# Patient Record
Sex: Female | Born: 1985 | Race: Black or African American | Hispanic: No | Marital: Married | Smoking: Never smoker
Health system: Southern US, Community
[De-identification: ages and names within clinical notes are randomized; demographics above are authoritative.]

## PROBLEM LIST (undated history)

## (undated) ENCOUNTER — Inpatient Hospital Stay (HOSPITAL_COMMUNITY): Payer: Self-pay

## (undated) DIAGNOSIS — D573 Sickle-cell trait: Secondary | ICD-10-CM

## (undated) DIAGNOSIS — D649 Anemia, unspecified: Secondary | ICD-10-CM

## (undated) HISTORY — PX: CYST REMOVAL NECK: SHX6281

## (undated) HISTORY — PX: NO PAST SURGERIES: SHX2092

## (undated) HISTORY — DX: Sickle-cell trait: D57.3

## (undated) HISTORY — PX: WISDOM TOOTH EXTRACTION: SHX21

---

## 2004-05-18 ENCOUNTER — Emergency Department (HOSPITAL_COMMUNITY): Admission: EM | Admit: 2004-05-18 | Discharge: 2004-05-18 | Payer: Self-pay | Admitting: Family Medicine

## 2004-06-11 ENCOUNTER — Emergency Department (HOSPITAL_COMMUNITY): Admission: EM | Admit: 2004-06-11 | Discharge: 2004-06-11 | Payer: Self-pay | Admitting: Family Medicine

## 2004-07-20 ENCOUNTER — Emergency Department (HOSPITAL_COMMUNITY): Admission: EM | Admit: 2004-07-20 | Discharge: 2004-07-20 | Payer: Self-pay

## 2004-10-05 ENCOUNTER — Emergency Department (HOSPITAL_COMMUNITY): Admission: EM | Admit: 2004-10-05 | Discharge: 2004-10-05 | Payer: Self-pay | Admitting: Family Medicine

## 2004-12-09 ENCOUNTER — Emergency Department (HOSPITAL_COMMUNITY): Admission: EM | Admit: 2004-12-09 | Discharge: 2004-12-10 | Payer: Self-pay | Admitting: Emergency Medicine

## 2006-03-05 ENCOUNTER — Emergency Department (HOSPITAL_COMMUNITY): Admission: EM | Admit: 2006-03-05 | Discharge: 2006-03-06 | Payer: Self-pay | Admitting: Emergency Medicine

## 2006-08-27 ENCOUNTER — Emergency Department (HOSPITAL_COMMUNITY): Admission: EM | Admit: 2006-08-27 | Discharge: 2006-08-27 | Payer: Self-pay | Admitting: Family Medicine

## 2006-09-29 ENCOUNTER — Ambulatory Visit (HOSPITAL_COMMUNITY): Admission: RE | Admit: 2006-09-29 | Discharge: 2006-09-29 | Payer: Self-pay | Admitting: Obstetrics & Gynecology

## 2006-12-15 ENCOUNTER — Ambulatory Visit (HOSPITAL_COMMUNITY): Admission: RE | Admit: 2006-12-15 | Discharge: 2006-12-15 | Payer: Self-pay | Admitting: Obstetrics and Gynecology

## 2007-01-16 ENCOUNTER — Inpatient Hospital Stay (HOSPITAL_COMMUNITY): Admission: AD | Admit: 2007-01-16 | Discharge: 2007-01-16 | Payer: Self-pay | Admitting: Obstetrics and Gynecology

## 2007-01-17 ENCOUNTER — Ambulatory Visit (HOSPITAL_COMMUNITY): Admission: RE | Admit: 2007-01-17 | Discharge: 2007-01-17 | Payer: Self-pay | Admitting: Obstetrics and Gynecology

## 2007-02-14 ENCOUNTER — Ambulatory Visit (HOSPITAL_COMMUNITY): Admission: RE | Admit: 2007-02-14 | Discharge: 2007-02-14 | Payer: Self-pay | Admitting: Obstetrics and Gynecology

## 2007-03-07 ENCOUNTER — Ambulatory Visit (HOSPITAL_COMMUNITY): Admission: RE | Admit: 2007-03-07 | Discharge: 2007-03-07 | Payer: Self-pay | Admitting: Obstetrics and Gynecology

## 2007-03-12 ENCOUNTER — Inpatient Hospital Stay (HOSPITAL_COMMUNITY): Admission: AD | Admit: 2007-03-12 | Discharge: 2007-03-12 | Payer: Self-pay | Admitting: Obstetrics and Gynecology

## 2007-03-13 ENCOUNTER — Inpatient Hospital Stay (HOSPITAL_COMMUNITY): Admission: AD | Admit: 2007-03-13 | Discharge: 2007-03-13 | Payer: Self-pay | Admitting: Obstetrics and Gynecology

## 2007-03-14 ENCOUNTER — Inpatient Hospital Stay (HOSPITAL_COMMUNITY): Admission: AD | Admit: 2007-03-14 | Discharge: 2007-03-14 | Payer: Self-pay | Admitting: Obstetrics and Gynecology

## 2007-03-15 ENCOUNTER — Encounter: Payer: Self-pay | Admitting: Obstetrics and Gynecology

## 2007-03-15 ENCOUNTER — Inpatient Hospital Stay (HOSPITAL_COMMUNITY): Admission: AD | Admit: 2007-03-15 | Discharge: 2007-03-16 | Payer: Self-pay | Admitting: Obstetrics and Gynecology

## 2007-03-28 ENCOUNTER — Ambulatory Visit (HOSPITAL_COMMUNITY): Admission: RE | Admit: 2007-03-28 | Discharge: 2007-03-28 | Payer: Self-pay | Admitting: Obstetrics and Gynecology

## 2007-04-18 ENCOUNTER — Ambulatory Visit (HOSPITAL_COMMUNITY): Admission: RE | Admit: 2007-04-18 | Discharge: 2007-04-18 | Payer: Self-pay | Admitting: Obstetrics and Gynecology

## 2007-05-01 ENCOUNTER — Inpatient Hospital Stay (HOSPITAL_COMMUNITY): Admission: AD | Admit: 2007-05-01 | Discharge: 2007-05-01 | Payer: Self-pay | Admitting: Obstetrics and Gynecology

## 2007-05-03 ENCOUNTER — Inpatient Hospital Stay (HOSPITAL_COMMUNITY): Admission: AD | Admit: 2007-05-03 | Discharge: 2007-05-06 | Payer: Self-pay | Admitting: Obstetrics and Gynecology

## 2007-05-03 ENCOUNTER — Encounter: Payer: Self-pay | Admitting: Obstetrics and Gynecology

## 2007-08-20 IMAGING — US US FETAL BPP W/O NONSTRESS
1 series · 14 of 28 positions shown · non-contrast
Comparison: none

OBSTETRICAL ULTRASOUND:

 This ultrasound exam was performed in the [HOSPITAL] Ultrasound Department.  The OB US report was generated in the AS system, and faxed to the ordering physician.  This report is also available in [REDACTED] PACS.

[Series 1: us fetal bpp w/o nonstress · 14 of 39 slices shown]
[im 2/39]
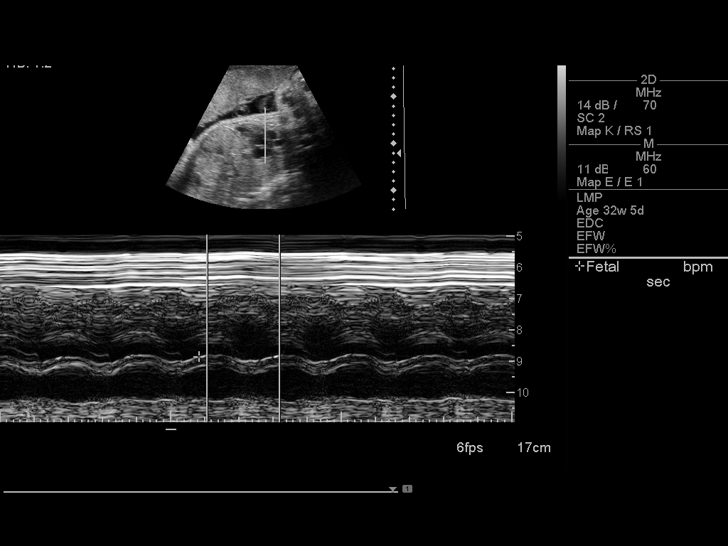
[im 5/39]
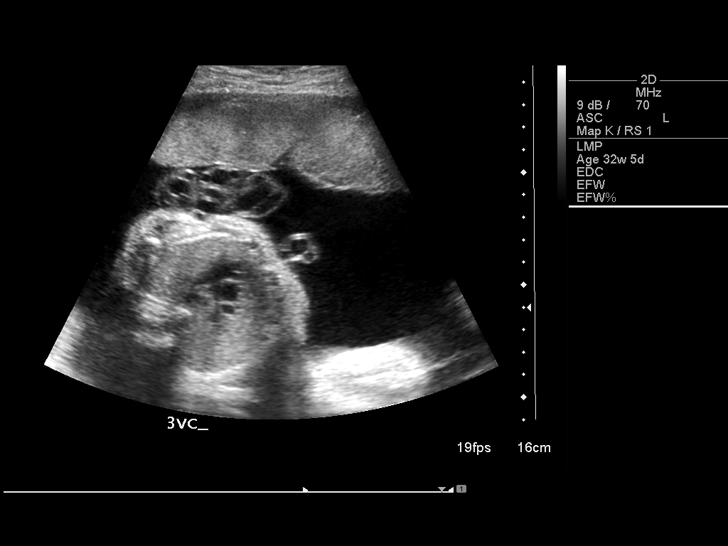
[im 8/39]
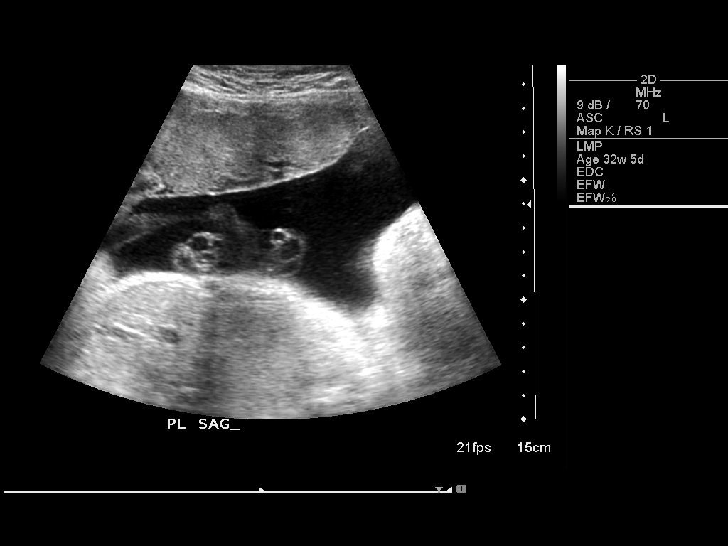
[im 10/39]
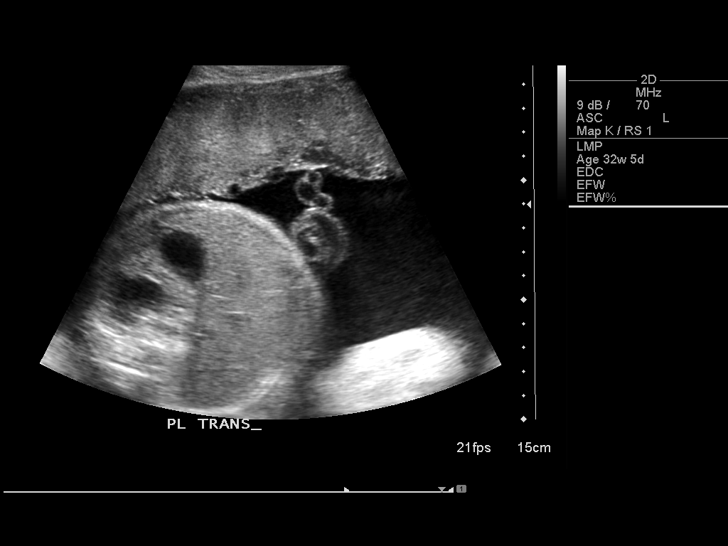
[im 13/39]
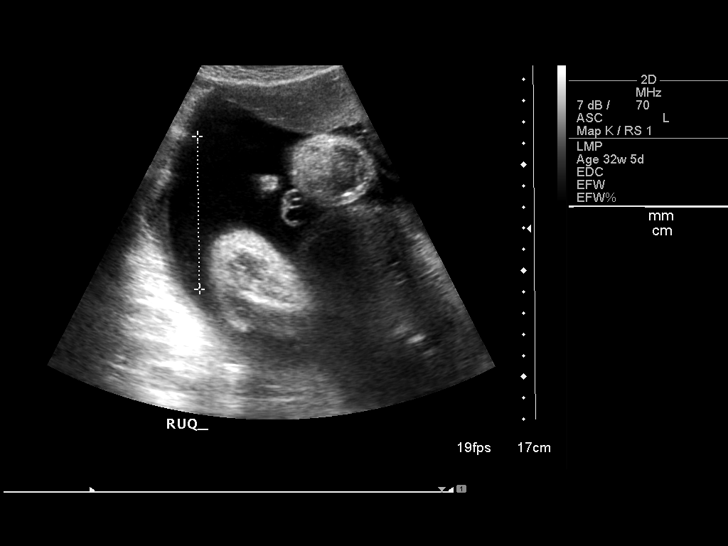
[im 16/39]
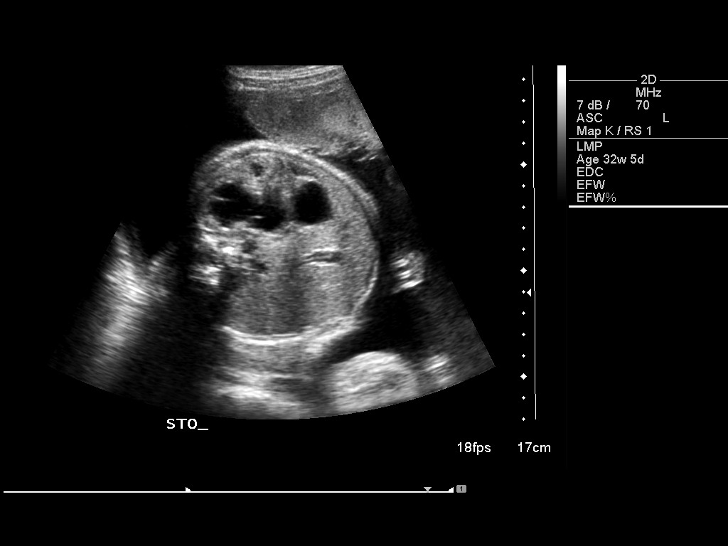
[im 19/39]
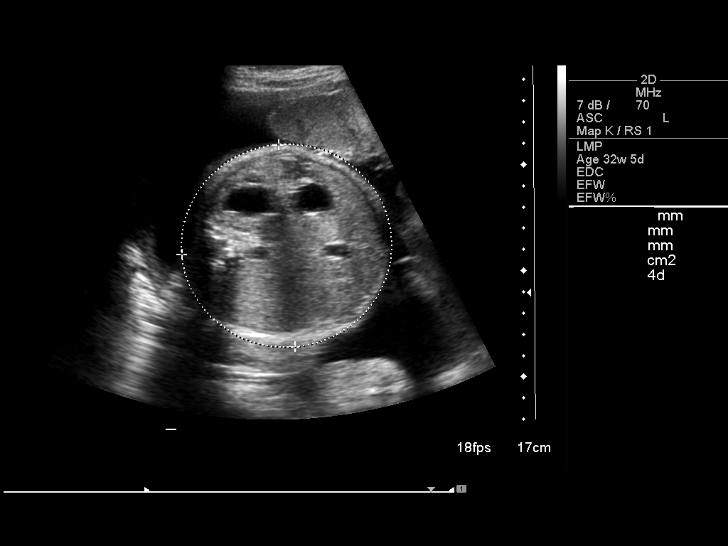
[im 22/39]
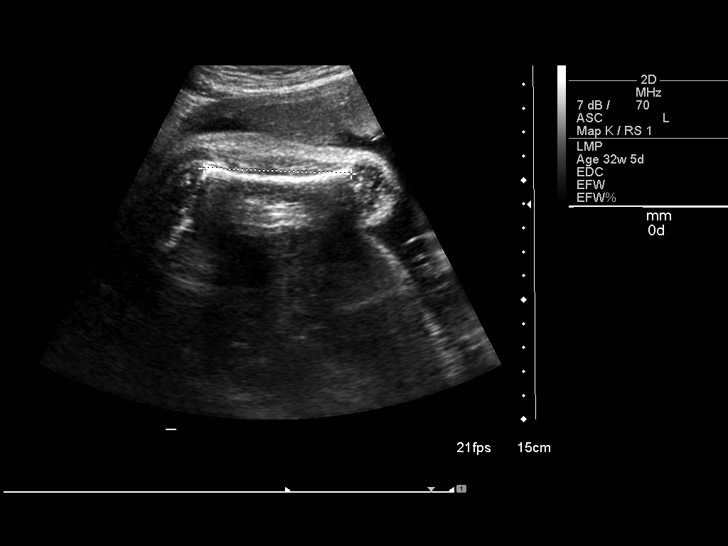
[im 24/39]
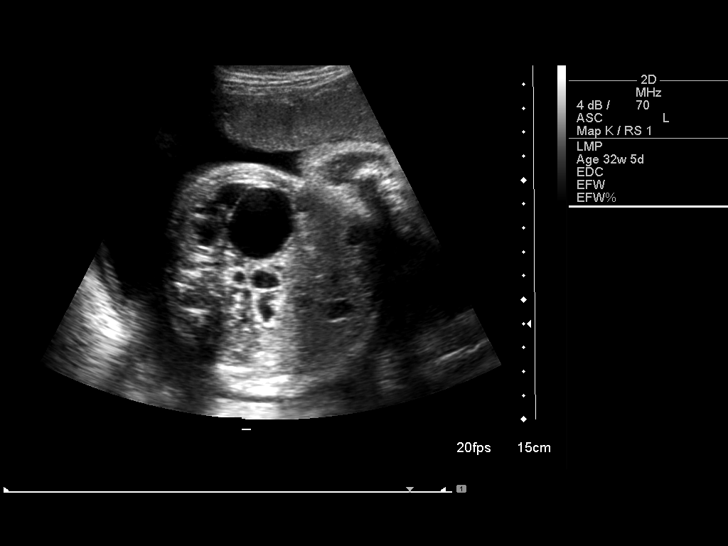
[im 27/39]
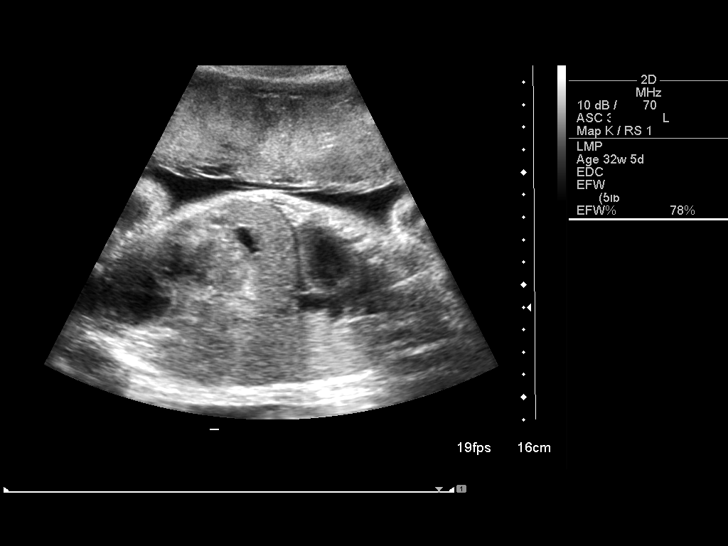
[im 30/39]
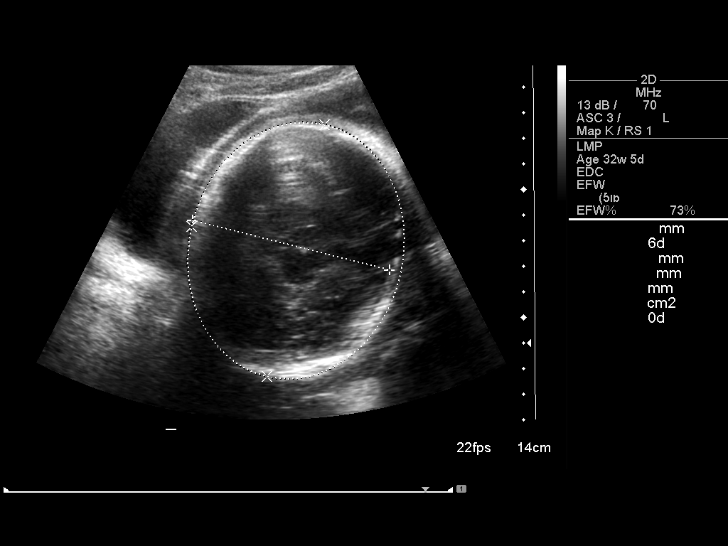
[im 33/39]
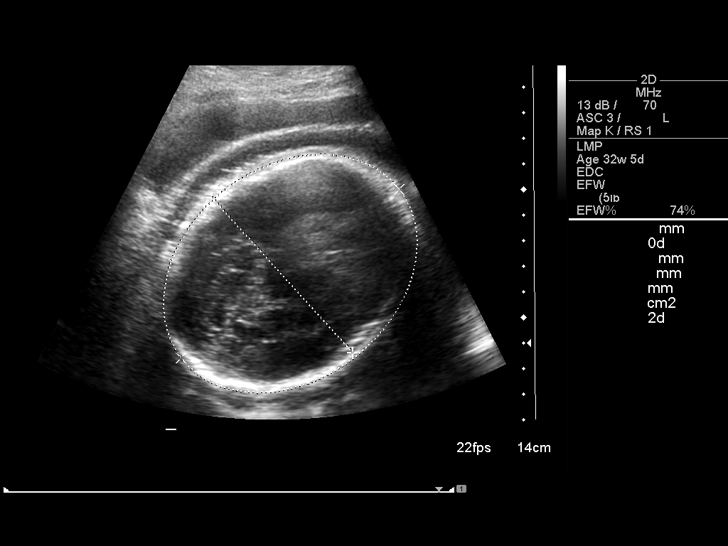
[im 36/39]
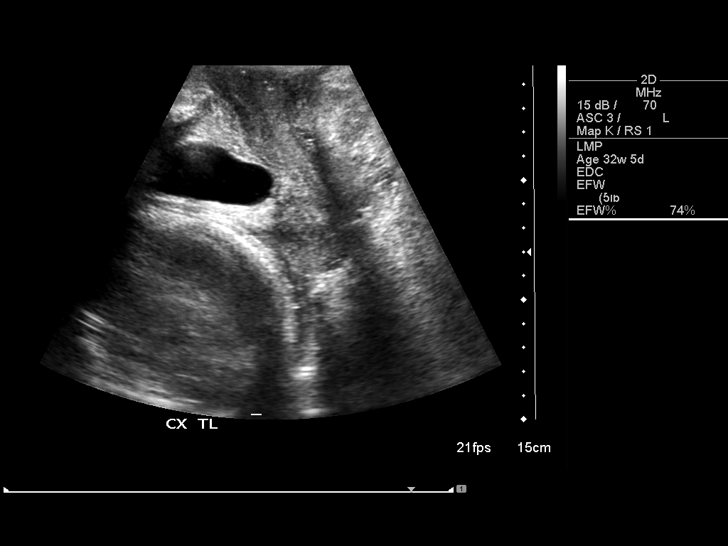
[im 39/39]
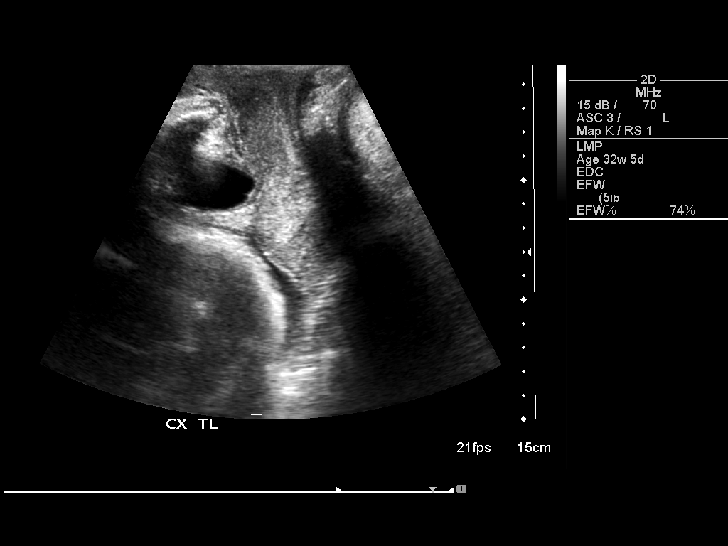

[14 of 28 positions shown; findings below may reference images not displayed]

IMPRESSION: See AS Obstetric US report.

## 2007-09-22 IMAGING — US US OB FOLLOW-UP
1 series · 14 of 28 positions shown · non-contrast
Comparison: none

OBSTETRICAL ULTRASOUND:
 This ultrasound was performed in The [HOSPITAL], and the AS OB/GYN report will be stored to [REDACTED] PACS.

[Series 1: us ob follow-up · 14 of 43 slices shown]
[im 2/43]
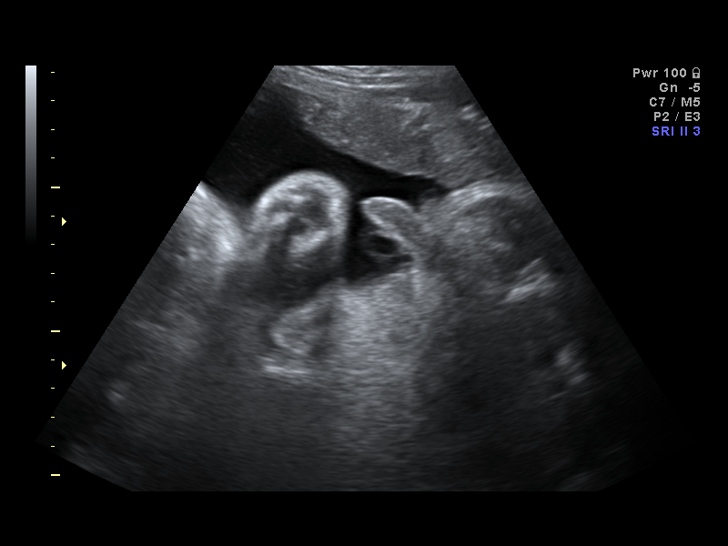
[im 5/43]
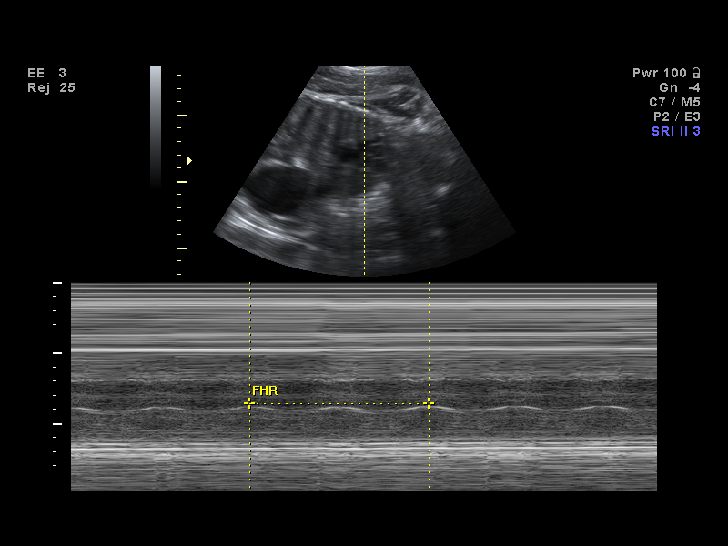
[im 8/43]
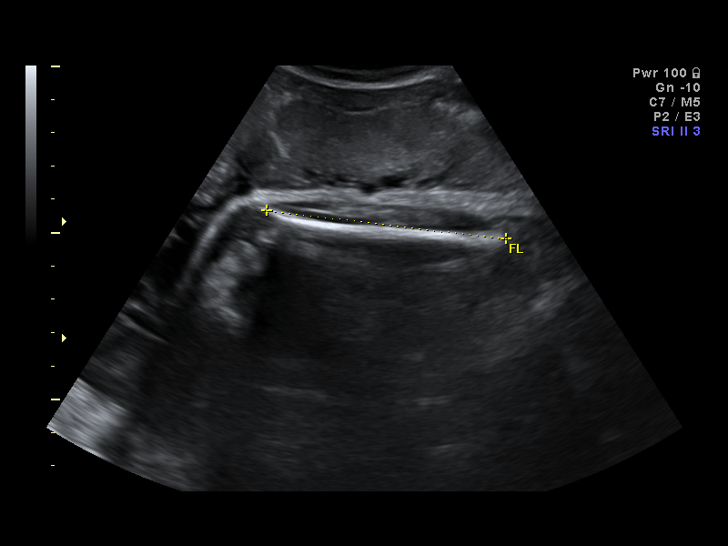
[im 11/43]
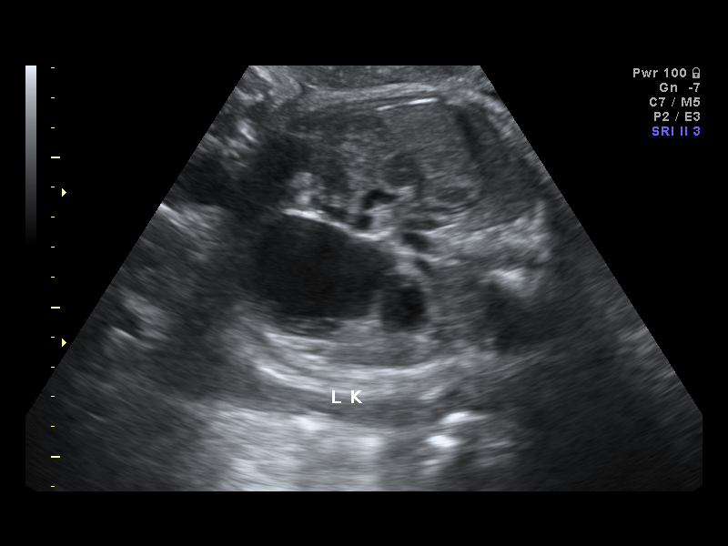
[im 15/43]
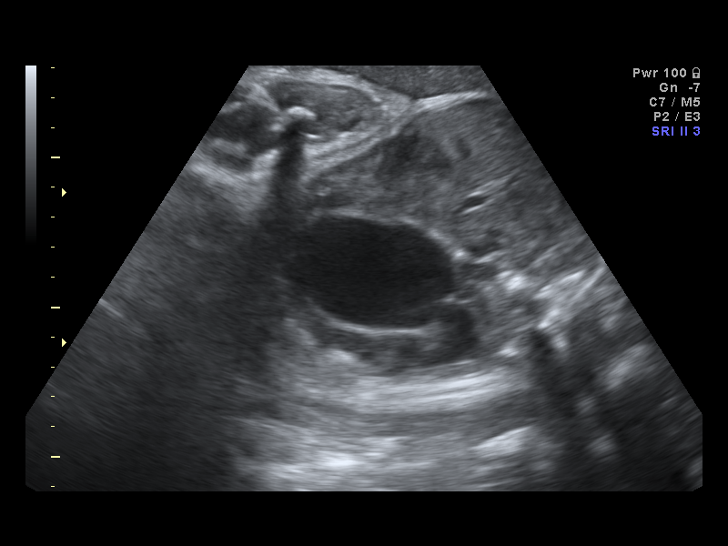
[im 18/43]
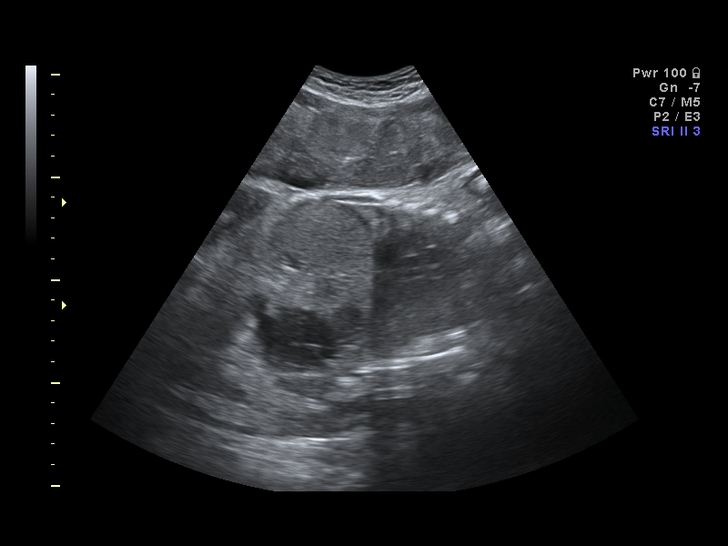
[im 21/43]
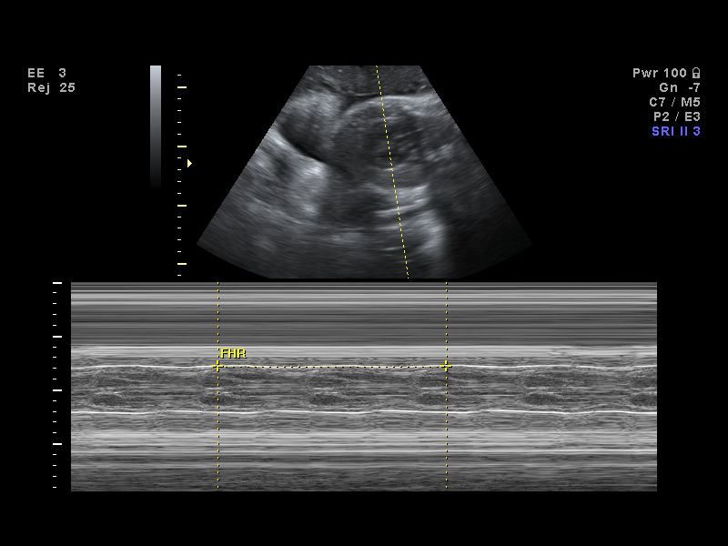
[im 24/43]
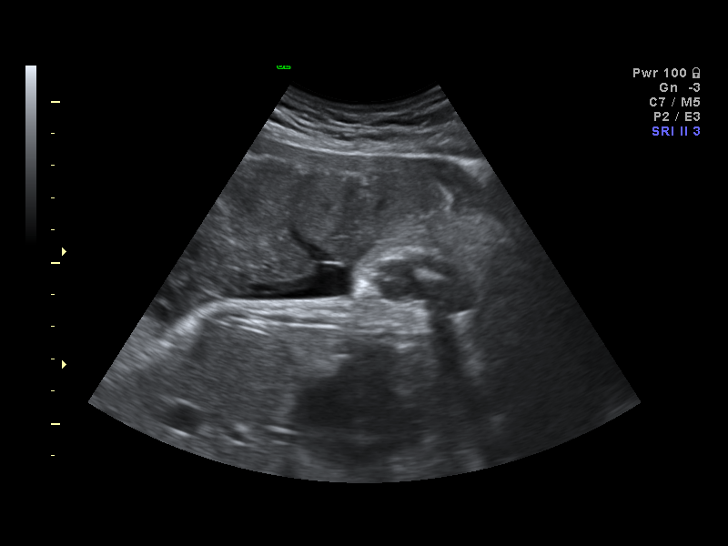
[im 27/43]
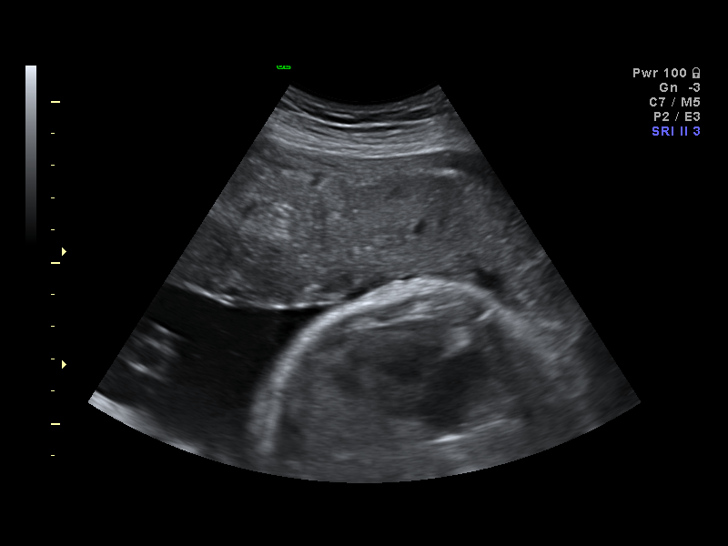
[im 30/43]
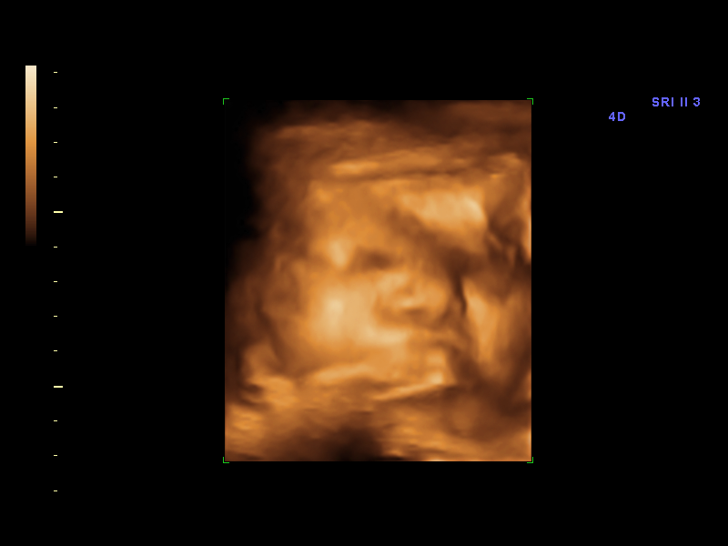
[im 33/43]
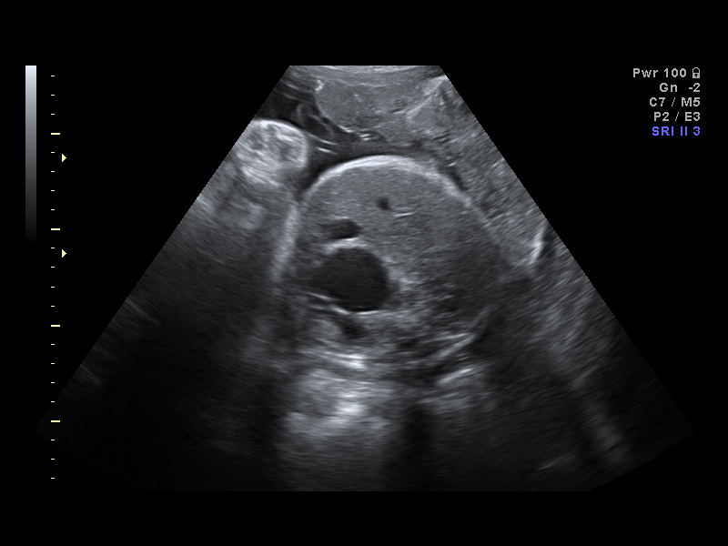
[im 36/43]
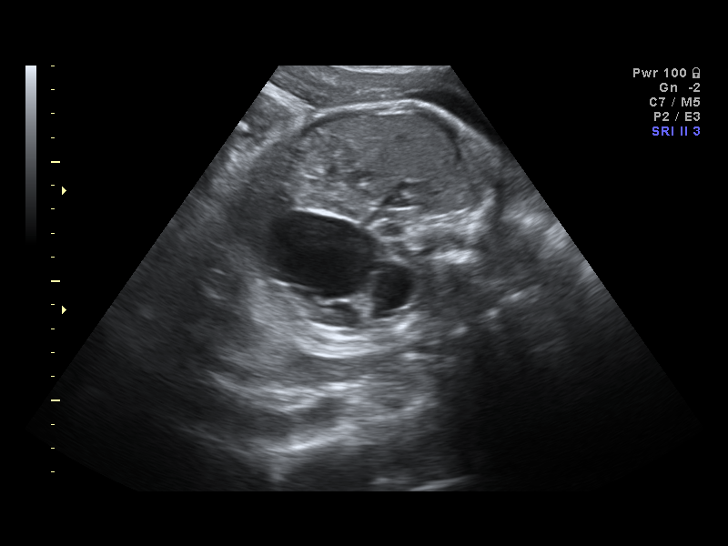
[im 39/43]
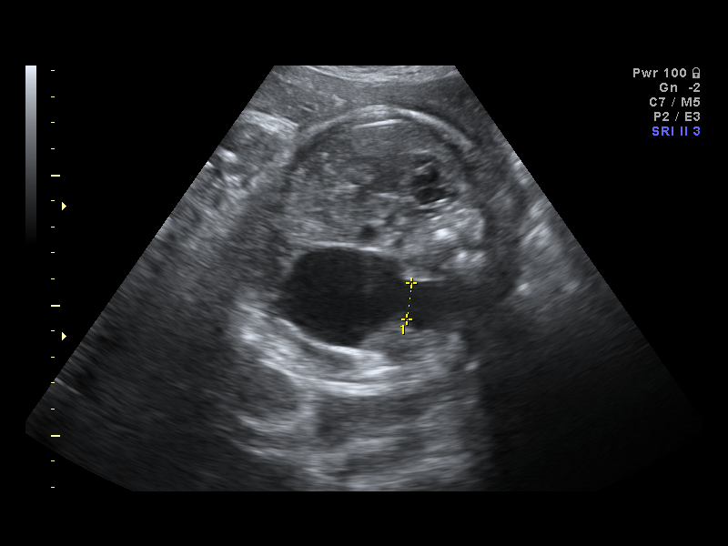
[im 43/43]
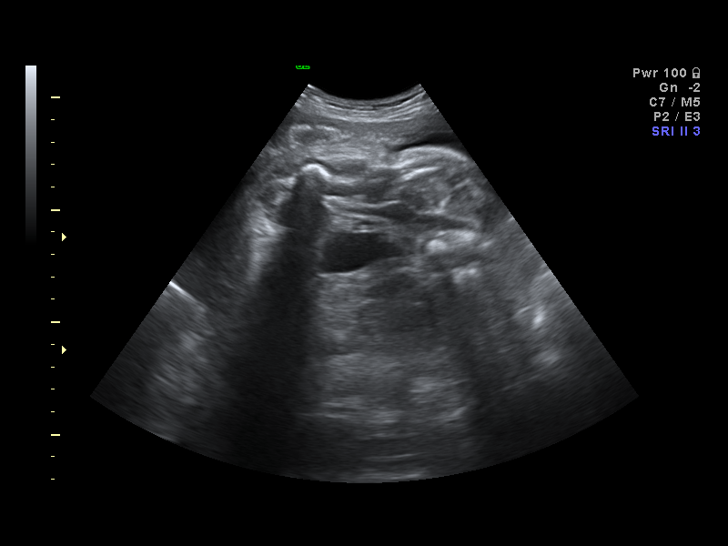

[14 of 28 positions shown; findings below may reference images not displayed]

IMPRESSION: The AS OB/GYN report has also been faxed to the ordering physician.

## 2010-12-26 ENCOUNTER — Encounter: Payer: Self-pay | Admitting: Obstetrics and Gynecology
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
## 2011-04-22 NOTE — Discharge Summary (Signed)
NAME:  Barbara Heath, Barbara Heath NO.:  [id number]   MEDICAL RECORD NO.:  [id number]          PATIENT TYPE:  OUT   LOCATION:  MFM                           FACILITY:  [facility]   PHYSICIAN:  Naima A. Dillard, M.D. DATE OF BIRTH:  Jun 28, 1986   DATE OF ADMISSION:  03/15/2007  DATE OF DISCHARGE:  03/16/2007                               DISCHARGE SUMMARY   ADMISSION DIAGNOSES:  1. Intrauterine pregnancy at 32-1/7 weeks.  2. Polyhydramnios.  3. Pre-term contractions without cervical change.   DISCHARGE DIAGNOSES:  1. Intrauterine pregnancy at 32-1/7 weeks.  2. Polyhydramnios.  3. Pre-term contractions without cervical change.   HOSPITAL PROCEDURES:  1. Electronic fetal monitoring.  2. Tocolysis.  3. Maternal fetal medicine consult.  4. Ultrasound.   HOSPITAL COURSE:  The patient was admitted in his with preterm  contractions, unresponsive to terbutaline and Procardia.  Her urinalysis  was negative.  Fetal fibronectin was negative, and cultures were all  negative.  She was placed on bedrest on the antenatal unit, given  betamethasone x2 doses, 24 hours apart.  She was given some Procardia  for tocolysis and analgesia as needed.  She was seen by maternal fetal  medicine who recommended antenatal steroids and minimization of  tocolytic use for symptomatic pain.  He did say that a short course of  Indocin might be considered, but did not recommended it for long-term  use, and he also recommended using some analgesics for symptomatic  relief.  He also suggested that an amniocentesis for reduction of fluid  might be considered later, but if we did that, that he would recommend  doing a karyotype analysis and evaluation of infection, but currently  the patient declines that.  We will do a biophysical profile and AFI  prior to discharge, and then the patient will go home with a follow  appointment to be done on Monday for NST and on Thursday for a repeat  biophysical profile with a  visit with Dr. Estanislado Pandy afterwards.   DISCHARGE LABS:  Urinalysis negative, white blood cell count 10.1,  hemoglobin 11, platelets 196, fetal fibronectin negative, GC and  chlamydia negative, RPR negative and group B strep negative.   DISCHARGE MEDICATIONS:  Darvocet 1-2 p.o. q. 4 hours p.r.n. pain.   DISCHARGE INSTRUCTIONS:  Include modified bedrest with observation for  contractions and immediate report to the hospital if she ruptures  membranes. Discharge follow-up will occur on Monday at 1:30 p.m. for NST  and Thursday at 11:15 a.m. for BP and visit with Dr. Estanislado Pandy afterwards,  and his condition at discharge was good.      Marie L. Williams, C.N.M.      Naima A. Normand Sloop, M.D.  Electronically Signed    MLW/MEDQ  D:  03/16/2007  T:  03/16/2007  Job:  [id number]
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
## 2012-03-27 ENCOUNTER — Other Ambulatory Visit (HOSPITAL_COMMUNITY)
Admission: RE | Admit: 2012-03-27 | Discharge: 2012-03-27 | Disposition: A | Discharge status: Home / Self Care | Payer: 59 | Source: Ambulatory Visit | Attending: Obstetrics and Gynecology | Admitting: Obstetrics and Gynecology

## 2012-03-27 DIAGNOSIS — Z124 Encounter for screening for malignant neoplasm of cervix: Secondary | ICD-10-CM | POA: Insufficient documentation

## 2012-03-27 DIAGNOSIS — Z113 Encounter for screening for infections with a predominantly sexual mode of transmission: Secondary | ICD-10-CM | POA: Insufficient documentation

## 2013-03-06 ENCOUNTER — Encounter (HOSPITAL_COMMUNITY): Payer: Self-pay

## 2013-03-06 ENCOUNTER — Inpatient Hospital Stay (HOSPITAL_COMMUNITY): Payer: Self-pay

## 2013-03-06 ENCOUNTER — Inpatient Hospital Stay (HOSPITAL_COMMUNITY)
Admission: AD | Admit: 2013-03-06 | Discharge: 2013-03-06 | Disposition: A | Discharge status: Home / Self Care | Payer: Self-pay | Source: Ambulatory Visit | Attending: Obstetrics & Gynecology | Admitting: Obstetrics & Gynecology

## 2013-03-06 DIAGNOSIS — O039 Complete or unspecified spontaneous abortion without complication: Secondary | ICD-10-CM

## 2013-03-06 DIAGNOSIS — R109 Unspecified abdominal pain: Secondary | ICD-10-CM | POA: Insufficient documentation

## 2013-03-06 LAB — POCT PREGNANCY, URINE: Preg Test, Ur: NEGATIVE

## 2013-03-06 LAB — HCG, QUANTITATIVE, PREGNANCY: hCG, Beta Chain, Quant, S: 3 m[IU]/mL (ref <5)

## 2013-03-06 NOTE — Progress Notes (Signed)
Dr Christell Constant called back and was notified of patient being uncomfortable, her statement of positive pregnancy test at home and the office. Our negative urine pregnancy test. His orders are to obtain BHCG and OB ultrasound. Marie williams cnm notified.

## 2013-03-06 NOTE — MAU Provider Note (Signed)
  History     CSN: [id number]  Arrival date and time: 03/06/13 1703   None     Chief Complaint  Patient presents with  . Abdominal Pain   HPI This is a 27 y.o. female at [redacted]w[redacted]d by LMP who presents with report of bleeding like a light period for several days. Upon arrival she was rocking back and forth with cramps. Soon thereafter, the cramps stopped. She had a Quant HCG at the office Monday that was reportedly 16.  She was sent here for a quant and [place].  RN Note: Dr Christell Constant called back and was notified of patient being uncomfortable, her statement of positive pregnancy test at home and the office. Our negative urine pregnancy test. His orders are to obtain BHCG and OB ultrasound. Marie williams cnm notified.   OB History   Grav Para Term Preterm Abortions TAB SAB Ect Mult Living   2 1 1       1       History reviewed. No pertinent past medical history.  History reviewed. No pertinent past surgical history.  History reviewed. No pertinent family history.  History  Substance Use Topics  . Smoking status: Never Smoker   . Smokeless tobacco: Not on file  . Alcohol Use: No    Allergies: No Known Allergies  Prescriptions prior to admission  Medication Sig Dispense Refill  . acetaminophen (TYLENOL) 500 MG tablet Take 500 mg by mouth every 6 (six) hours as needed for pain. Pain in vagina      . Prenatal Vit-Fe Fumarate-FA (PRENATAL MULTIVITAMIN) TABS Take 1 tablet by mouth daily at 12 noon.        Review of Systems  Constitutional: Negative for fever and chills.  Gastrointestinal: Positive for abdominal pain. Negative for nausea, vomiting, diarrhea and constipation.  Genitourinary: Negative for dysuria.  Neurological: Negative for headaches.   Physical Exam   Blood pressure 124/77, pulse 80, temperature 97.9 F (36.6 C), temperature source Oral, resp. rate 20, height 5\' 4"  (1.626 m), weight 161 lb 2 oz (73.086 kg), last menstrual period 01/30/2013, SpO2 99.00%.  Physical Exam   Constitutional: She is oriented to person, place, and time. She appears well-developed and well-nourished. No distress (when I was there, she was not hurting).  Cardiovascular: Normal rate.   Respiratory: Effort normal.  GI: Soft. She exhibits no distension. There is no tenderness. There is no rebound and no guarding.  Genitourinary:  Declines pelvic exam  Musculoskeletal: Normal range of motion.  Neurological: She is alert and oriented to person, place, and time.  Skin: Skin is warm and dry.  Psychiatric: She has a normal mood and affect.    MAU Course  Procedures  MDM Reviewed results with Dr Christell Constant and patient. Will check ABO/RH and pt will come back if Rh Neg to get RhIg.  Explained importance. Pt declines pelvic exam, states pain is entirely gone now. >>> BLood type is O+  Assessment and Plan  A:  Pregnancy at [redacted]w[redacted]d by LMP      Spontaneous abortion  P:  Discharge home      Followup in office      Ibuprofen PRN  [facility] 03/06/2013, 7:24 PM   Agree with above care and plan dm

## 2013-05-15 ENCOUNTER — Encounter (HOSPITAL_COMMUNITY): Payer: Self-pay

## 2013-05-15 ENCOUNTER — Inpatient Hospital Stay (HOSPITAL_COMMUNITY)
Admission: AD | Admit: 2013-05-15 | Discharge: 2013-05-15 | Disposition: A | Discharge status: Home / Self Care | Payer: Medicaid Other | Source: Ambulatory Visit | Attending: Obstetrics & Gynecology | Admitting: Obstetrics & Gynecology

## 2013-05-15 ENCOUNTER — Inpatient Hospital Stay (HOSPITAL_COMMUNITY): Payer: Medicaid Other

## 2013-05-15 DIAGNOSIS — O99891 Other specified diseases and conditions complicating pregnancy: Secondary | ICD-10-CM | POA: Insufficient documentation

## 2013-05-15 DIAGNOSIS — R109 Unspecified abdominal pain: Secondary | ICD-10-CM | POA: Insufficient documentation

## 2013-05-15 DIAGNOSIS — O26899 Other specified pregnancy related conditions, unspecified trimester: Secondary | ICD-10-CM

## 2013-05-15 LAB — URINALYSIS, ROUTINE W REFLEX MICROSCOPIC
Hgb urine dipstick: NEGATIVE
Ketones, ur: NEGATIVE mg/dL
Protein, ur: NEGATIVE mg/dL
Urobilinogen, UA: 1 mg/dL (ref 0.0–1.0)

## 2013-05-15 LAB — CBC WITH DIFFERENTIAL/PLATELET
Basophils Absolute: 0 10*3/uL (ref 0.0–0.1)
Basophils Relative: 0 % (ref 0–1)
Eosinophils Absolute: 0.3 10*3/uL (ref 0.0–0.7)
Eosinophils Relative: 4 % (ref 0–5)
HCT: 36.3 % (ref 36.0–46.0)
Lymphocytes Relative: 36 % (ref 12–46)
MCH: 28.1 pg (ref 26.0–34.0)
MCHC: 33.6 g/dL (ref 30.0–36.0)
MCV: 83.6 fL (ref 78.0–100.0)
Monocytes Absolute: 0.9 10*3/uL (ref 0.1–1.0)
Platelets: 235 10*3/uL (ref 150–400)
RDW: 13.3 % (ref 11.5–15.5)
WBC: 7.3 10*3/uL (ref 4.0–10.5)

## 2013-05-15 LAB — WET PREP, GENITAL: Yeast Wet Prep HPF POC: NONE SEEN

## 2013-05-15 LAB — POCT PREGNANCY, URINE: Preg Test, Ur: POSITIVE — AB

## 2013-05-15 NOTE — MAU Provider Note (Signed)
History     CSN: [id number]  Arrival date and time: 05/15/13 1625   First Provider Initiated Contact with Patient 05/15/13 1717      Chief Complaint  Patient presents with  . Possible Pregnancy  . Abdominal Cramping   HPI Barbara Heath is 27 y.o. G3P1011 [redacted]w[redacted]d weeks presenting with intermittent cramping that began today.   Had a + HPT 2 days ago.  LMP 5/6--had miscarriage in April.  Only 1 cycle since that time.  Was a patient of Dr. Thomasene Lot, just got letter she was no longer seeing OB patients--plans to go to [facility].  Trying to conceive.  Denies vaginal bleeding.     History reviewed. No pertinent past medical history.  Past Surgical History  Procedure Laterality Date  . Wisdom tooth extraction      Family History  Problem Relation Age of Onset  . COPD Paternal Grandmother     History  Substance Use Topics  . Smoking status: Never Smoker   . Smokeless tobacco: Not on file  . Alcohol Use: No     Comment: Socially, drinks wine    Allergies: No Known Allergies  Prescriptions prior to admission  Medication Sig Dispense Refill  . Prenatal Vit-Fe Fumarate-FA (PRENATAL MULTIVITAMIN) TABS Take 1 tablet by mouth daily at 12 noon.        Review of Systems  Constitutional: Negative for fever and chills.  Gastrointestinal: Positive for nausea and abdominal pain (lower cramping). Negative for vomiting.  Genitourinary:       Neg for abnormal vaginal discharge or bleeding   Physical Exam   Blood pressure 110/65, pulse 90, temperature 98.4 F (36.9 C), temperature source Oral, resp. rate 18, height 5\' 4"  (1.626 m), weight 162 lb 6.4 oz (73.664 kg), last menstrual period 04/09/2013, unknown if currently breastfeeding.  Physical Exam  Constitutional: She is oriented to person, place, and time. She appears well-developed and well-nourished. No distress.  HENT:  Head: Normocephalic.  Neck: Normal range of motion.  Cardiovascular: Normal rate.   Respiratory: Effort  normal.  GI: Soft. She exhibits no distension and no mass. There is no tenderness. There is no rebound and no guarding.  Genitourinary: There is no rash, tenderness or lesion on the right labia. There is no rash, tenderness or lesion on the left labia. Uterus is enlarged (soft, slightly enlarged). Uterus is not tender. Cervix exhibits no motion tenderness, no discharge and no friability. Right adnexum displays no mass, no tenderness and no fullness. Left adnexum displays no mass, no tenderness and no fullness. No tenderness around the vagina. Vaginal discharge (mod amount of thin white discharge without odor) found.  Neurological: She is alert and oriented to person, place, and time.  Skin: Skin is warm and dry.  Psychiatric: She has a normal mood and affect. Her behavior is normal.   BLOOD TYPE PER PREVIOUS RECORD IS O POSITIVE  Results for orders placed during the hospital encounter of 05/15/13 (from the past 24 hour(s))  URINALYSIS, ROUTINE W REFLEX MICROSCOPIC     Status: None   Collection Time    05/15/13  4:45 PM      Result Value Range   Color, Urine YELLOW  YELLOW   APPearance CLEAR  CLEAR   Specific Gravity, Urine 1.020  1.005 - 1.030   pH 7.5  5.0 - 8.0   Glucose, UA NEGATIVE  NEGATIVE mg/dL   Hgb urine dipstick NEGATIVE  NEGATIVE   Bilirubin Urine NEGATIVE  NEGATIVE   Ketones,  ur NEGATIVE  NEGATIVE mg/dL   Protein, ur NEGATIVE  NEGATIVE mg/dL   Urobilinogen, UA 1.0  0.0 - 1.0 mg/dL   Nitrite NEGATIVE  NEGATIVE   Leukocytes, UA NEGATIVE  NEGATIVE  POCT PREGNANCY, URINE     Status: Abnormal   Collection Time    05/15/13  4:51 PM      Result Value Range   Preg Test, Ur POSITIVE (*) NEGATIVE  WET PREP, GENITAL     Status: Abnormal   Collection Time    05/15/13  5:30 PM      Result Value Range   Yeast Wet Prep HPF POC NONE SEEN  NONE SEEN   Trich, Wet Prep NONE SEEN  NONE SEEN   Clue Cells Wet Prep HPF POC NONE SEEN  NONE SEEN   WBC, Wet Prep HPF POC FEW (*) NONE SEEN   HCG, QUANTITATIVE, PREGNANCY     Status: Abnormal   Collection Time    05/15/13  6:01 PM      Result Value Range   hCG, Beta Chain, Quant, S 2143 (*) <5 mIU/mL  CBC WITH DIFFERENTIAL     Status: Abnormal   Collection Time    05/15/13  6:01 PM      Result Value Range   WBC 7.3  4.0 - 10.5 K/uL   RBC 4.34  3.87 - 5.11 MIL/uL   Hemoglobin 12.2  12.0 - 15.0 g/dL   HCT 16.1  09.6 - 04.5 %   MCV 83.6  78.0 - 100.0 fL   MCH 28.1  26.0 - 34.0 pg   MCHC 33.6  30.0 - 36.0 g/dL   RDW 40.9  81.1 - 91.4 %   Platelets 235  150 - 400 K/uL   Neutrophils Relative % 47  43 - 77 %   Neutro Abs 3.4  1.7 - 7.7 K/uL   Lymphocytes Relative 36  12 - 46 %   Lymphs Abs 2.6  0.7 - 4.0 K/uL   Monocytes Relative 13 (*) 3 - 12 %   Monocytes Absolute 0.9  0.1 - 1.0 K/uL   Eosinophils Relative 4  0 - 5 %   Eosinophils Absolute 0.3  0.0 - 0.7 K/uL   Basophils Relative 0  0 - 1 %   Basophils Absolute 0.0  0.0 - 0.1 K/uL       *RADIOLOGY REPORT*  Clinical Data: Pelvic cramping and early pregnancy with estimated  gestational age of [redacted] weeks and 1 day by last menstrual period.  OBSTETRIC <14 WK [place] AND TRANSVAGINAL OB US  Technique: Both transabdominal and transvaginal ultrasound  examinations were performed for complete evaluation of the  gestation as well as the maternal uterus, adnexal regions, and  pelvic cul-de-sac. Transvaginal technique was performed to assess  early pregnancy.  Comparison: 03/06/2013  Intrauterine gestational sac: Small gestational sac identified.  Yolk sac: Tiny early yolk sac is visualized.  Embryo: A fetal pole is not yet visible.  Cardiac Activity: No fetal cardiac activity is identified.  No subchorionic hemorrhage is identified.  MSD: 4.7 mm 5 w 0 d  [place] EDC: 01/15/2014  Maternal uterus/adnexae:  Complex cyst of the right ovary measures up to 2.6 cm in greatest  diameter and is consistent with a corpus luteal cyst. There is no  evidence of adnexal mass. A trace amount of  free fluid is  identified.  IMPRESSION:  Early intrauterine gestation with estimated gestational age of [redacted]  weeks and 0-days based on mean sac  diameter. It is still too early  to detect a true fetal pole or fetal cardiac activity.  Original Report Authenticated By: Irish Lack, M.D.    MAU Course  Procedures  GC/CHL cultures not done.  Neg in Dr. Thomasene Lot office in April   MDM  Assessment and Plan  A:  Cramping in early pregnancy       History of miscarriage 4/14       U/S shows small intrauterine gestational sac measuring [redacted]w[redacted]d gestation without FP or CA  P:  Explained to patient that she will need a repeat ultrasound in 10-14 days.  She plans care with [facility] and would like to call them for followup.       Return for worsening sxs    Ivianna Notch,EVE M 05/15/2013, 7:37 PM

## 2013-05-15 NOTE — MAU Note (Addendum)
Pt states that she completed an at home pregnancy test and wanted to verify that she was pregnant here in MAU, she states her LMP 04/09/13. Pt stated that today she began to experience intermittent 4/10 lower abdominal cramping. Pt states she has not taken any pain medication for the pain. Pt states she has been experiencing some thin vaginal discharge with no odor. Pt denies any vaginal bleeding

## 2013-05-16 NOTE — MAU Provider Note (Signed)
Attestation of Attending Supervision of Advanced Practitioner (CNM/NP): Evaluation and management procedures were performed by the Advanced Practitioner under my supervision and collaboration.  I have reviewed the Advanced Practitioner's note and chart, and I agree with the management and plan.  Barbara Heath, Barbara Heath 12:14 PM

## 2013-07-11 ENCOUNTER — Encounter: Payer: Self-pay | Admitting: Family Medicine

## 2013-07-11 ENCOUNTER — Ambulatory Visit (INDEPENDENT_AMBULATORY_CARE_PROVIDER_SITE_OTHER): Payer: Medicaid Other | Admitting: Family Medicine

## 2013-07-11 VITALS — BP 107/70 | Temp 97.2°F | Wt 163.6 lb

## 2013-07-11 DIAGNOSIS — Z3481 Encounter for supervision of other normal pregnancy, first trimester: Secondary | ICD-10-CM

## 2013-07-11 DIAGNOSIS — Z3480 Encounter for supervision of other normal pregnancy, unspecified trimester: Secondary | ICD-10-CM

## 2013-07-11 DIAGNOSIS — D573 Sickle-cell trait: Secondary | ICD-10-CM

## 2013-07-11 LAB — POCT URINALYSIS DIP (DEVICE)
Hgb urine dipstick: NEGATIVE
Ketones, ur: NEGATIVE mg/dL
Protein, ur: NEGATIVE mg/dL
Specific Gravity, Urine: 1.02 (ref 1.005–1.030)
Urobilinogen, UA: 1 mg/dL (ref 0.0–1.0)
pH: 7.5 (ref 5.0–8.0)

## 2013-07-11 LAB — HIV ANTIBODY (ROUTINE TESTING W REFLEX): HIV: NONREACTIVE

## 2013-07-11 NOTE — Progress Notes (Signed)
Nucal translucency scheduled with MFM for 07/15/13 at 8:15 am.

## 2013-07-11 NOTE — Progress Notes (Signed)
Pulse- 90 New ob packet given Weight gain 25-35lb

## 2013-07-11 NOTE — Patient Instructions (Signed)
Pregnancy - Second Trimester The second trimester of pregnancy (3 to 6 months) is a period of rapid growth for you and your baby. At the end of the sixth month, your baby is about 9 inches long and weighs 1 1/2 pounds. You will begin to feel the baby move between 18 and 20 weeks of the pregnancy. This is called quickening. Weight gain is faster. A clear fluid (colostrum) may leak out of your breasts. You may feel small contractions of the womb (uterus). This is known as false labor or Braxton-Hicks contractions. This is like a practice for labor when the baby is ready to be born. Usually, the problems with morning sickness have usually passed by the end of your first trimester. Some women develop small dark blotches (called cholasma, mask of pregnancy) on their face that usually goes away after the baby is born. Exposure to the sun makes the blotches worse. Acne may also develop in some pregnant women and pregnant women who have acne, may find that it goes away. PRENATAL EXAMS  Blood work may continue to be done during prenatal exams. These tests are done to check on your health and the probable health of your baby. Blood work is used to follow your blood levels (hemoglobin). Anemia (low hemoglobin) is common during pregnancy. Iron and vitamins are given to help prevent this. You will also be checked for diabetes between 24 and 28 weeks of the pregnancy. Some of the previous blood tests may be repeated.  The size of the uterus is measured during each visit. This is to make sure that the baby is continuing to grow properly according to the dates of the pregnancy.  Your blood pressure is checked every prenatal visit. This is to make sure you are not getting toxemia.  Your urine is checked to make sure you do not have an infection, diabetes or protein in the urine.  Your weight is checked often to make sure gains are happening at the suggested rate. This is to ensure that both you and your baby are  growing normally.  Sometimes, an ultrasound is performed to confirm the proper growth and development of the baby. This is a test which bounces harmless sound waves off the baby so your caregiver can more accurately determine due dates. Sometimes, a test is done on the amniotic fluid surrounding the baby. This test is called an amniocentesis. The amniotic fluid is obtained by sticking a needle into the belly (abdomen). This is done to check the chromosomes in instances where there is a concern about possible genetic problems with the baby. It is also sometimes done near the end of pregnancy if an early delivery is required. In this case, it is done to help make sure the baby's lungs are mature enough for the baby to live outside of the womb. CHANGES OCCURING IN THE SECOND TRIMESTER OF PREGNANCY Your body goes through many changes during pregnancy. They vary from person to person. Talk to your caregiver about changes you notice that you are concerned about.  During the second trimester, you will likely have an increase in your appetite. It is normal to have cravings for certain foods. This varies from person to person and pregnancy to pregnancy.  Your lower abdomen will begin to bulge.  You may have to urinate more often because the uterus and baby are pressing on your bladder. It is also common to get more bladder infections during pregnancy. You can help this by drinking lots of fluids   and emptying your bladder before and after intercourse.  You may begin to get stretch marks on your hips, abdomen, and breasts. These are normal changes in the body during pregnancy. There are no exercises or medicines to take that prevent this change.  You may begin to develop swollen and bulging veins (varicose veins) in your legs. Wearing support hose, elevating your feet for 15 minutes, 3 to 4 times a day and limiting salt in your diet helps lessen the problem.  Heartburn may develop as the uterus grows and  pushes up against the stomach. Antacids recommended by your caregiver helps with this problem. Also, eating smaller meals 4 to 5 times a day helps.  Constipation can be treated with a stool softener or adding bulk to your diet. Drinking lots of fluids, and eating vegetables, fruits, and whole grains are helpful.  Exercising is also helpful. If you have been very active up until your pregnancy, most of these activities can be continued during your pregnancy. If you have been less active, it is helpful to start an exercise program such as walking.  Hemorrhoids may develop at the end of the second trimester. Warm sitz baths and hemorrhoid cream recommended by your caregiver helps hemorrhoid problems.  Backaches may develop during this time of your pregnancy. Avoid heavy lifting, wear low heal shoes, and practice good posture to help with backache problems.  Some pregnant women develop tingling and numbness of their hand and fingers because of swelling and tightening of ligaments in the wrist (carpel tunnel syndrome). This goes away after the baby is born.  As your breasts enlarge, you may have to get a bigger bra. Get a comfortable, cotton, support bra. Do not get a nursing bra until the last month of the pregnancy if you will be nursing the baby.  You may get a dark line from your belly button to the pubic area called the linea nigra.  You may develop rosy cheeks because of increase blood flow to the face.  You may develop spider looking lines of the face, neck, arms, and chest. These go away after the baby is born. HOME CARE INSTRUCTIONS   It is extremely important to avoid all smoking, herbs, alcohol, and unprescribed drugs during your pregnancy. These chemicals affect the formation and growth of the baby. Avoid these chemicals throughout the pregnancy to ensure the delivery of a healthy infant.  Most of your home care instructions are the same as suggested for the first trimester of your  pregnancy. Keep your caregiver's appointments. Follow your caregiver's instructions regarding medicine use, exercise, and diet.  During pregnancy, you are providing food for you and your baby. Continue to eat regular, well-balanced meals. Choose foods such as meat, fish, milk and other low fat dairy products, vegetables, fruits, and whole-grain breads and cereals. Your caregiver will tell you of the ideal weight gain.  A physical sexual relationship may be continued up until near the end of pregnancy if there are no other problems. Problems could include early (premature) leaking of amniotic fluid from the membranes, vaginal bleeding, abdominal pain, or other medical or pregnancy problems.  Exercise regularly if there are no restrictions. Check with your caregiver if you are unsure of the safety of some of your exercises. The greatest weight gain will occur in the last 2 trimesters of pregnancy. Exercise will help you:  Control your weight.  Get you in shape for labor and delivery.  Lose weight after you have the baby.  Wear   a good support or jogging bra for breast tenderness during pregnancy. This may help if worn during sleep. Pads or tissues may be used in the bra if you are leaking colostrum.  Do not use hot tubs, steam rooms or saunas throughout the pregnancy.  Wear your seat belt at all times when driving. This protects you and your baby if you are in an accident.  Avoid raw meat, uncooked cheese, cat litter boxes, and soil used by cats. These carry germs that can cause birth defects in the baby.  The second trimester is also a good time to visit your dentist for your dental health if this has not been done yet. Getting your teeth cleaned is okay. Use a soft toothbrush. Brush gently during pregnancy.  It is easier to leak urine during pregnancy. Tightening up and strengthening the pelvic muscles will help with this problem. Practice stopping your urination while you are going to the  bathroom. These are the same muscles you need to strengthen. It is also the muscles you would use as if you were trying to stop from passing gas. You can practice tightening these muscles up 10 times a set and repeating this about 3 times per day. Once you know what muscles to tighten up, do not perform these exercises during urination. It is more likely to contribute to an infection by backing up the urine.  Ask for help if you have financial, counseling, or nutritional needs during pregnancy. Your caregiver will be able to offer counseling for these needs as well as refer you for other special needs.  Your skin may become oily. If so, wash your face with mild soap, use non-greasy moisturizer and oil or cream based makeup. MEDICINES AND DRUG USE IN PREGNANCY  Take prenatal vitamins as directed. The vitamin should contain 1 milligram of folic acid. Keep all vitamins out of reach of children. Only a couple vitamins or tablets containing iron may be fatal to a baby or young child when ingested.  Avoid use of all medicines, including herbs, over-the-counter medicines, not prescribed or suggested by your caregiver. Only take over-the-counter or prescription medicines for pain, discomfort, or fever as directed by your caregiver. Do not use aspirin.  Let your caregiver also know about herbs you may be using.  Alcohol is related to a number of birth defects. This includes fetal alcohol syndrome. All alcohol, in any form, should be avoided completely. Smoking will cause low birth rate and premature babies.  Street or illegal drugs are very harmful to the baby. They are absolutely forbidden. A baby born to an addicted mother will be addicted at birth. The baby will go through the same withdrawal an adult does. SEEK MEDICAL CARE IF:  You have any concerns or worries during your pregnancy. It is better to call with your questions if you feel they cannot wait, rather than worry about them. SEEK IMMEDIATE  MEDICAL CARE IF:   An unexplained oral temperature above 102 F (38.9 C) develops, or as your caregiver suggests.  You have leaking of fluid from the vagina (birth canal). If leaking membranes are suspected, take your temperature and tell your caregiver of this when you call.  There is vaginal spotting, bleeding, or passing clots. Tell your caregiver of the amount and how many pads are used. Light spotting in pregnancy is common, especially following intercourse.  You develop a bad smelling vaginal discharge with a change in the color from clear to white.  You continue to feel   sick to your stomach (nauseated) and have no relief from remedies suggested. You vomit blood or coffee ground-like materials.  You lose more than 2 pounds of weight or gain more than 2 pounds of weight over 1 week, or as suggested by your caregiver.  You notice swelling of your face, hands, feet, or legs.  You get exposed to German measles and have never had them.  You are exposed to fifth disease or chickenpox.  You develop belly (abdominal) pain. Round ligament discomfort is a common non-cancerous (benign) cause of abdominal pain in pregnancy. Your caregiver still must evaluate you.  You develop a bad headache that does not go away.  You develop fever, diarrhea, pain with urination, or shortness of breath.  You develop visual problems, blurry, or double vision.  You fall or are in a car accident or any kind of trauma.  There is mental or physical violence at home. Document Released: 11/15/2001 Document Revised: 08/15/2012 Document Reviewed: 05/20/2009 [employer] Patient Information 2014 [employer].  Breastfeeding A change in hormones during your pregnancy causes growth of your breast tissue and an increase in number and size of milk ducts. The hormone prolactin allows proteins, sugars, and fats from your blood supply to make breast milk in your milk-producing glands. The hormone progesterone prevents  breast milk from being released before the birth of your baby. After the birth of your baby, your progesterone level decreases allowing breast milk to be released. Thoughts of your baby, as well as his or her sucking or crying, can stimulate the release of milk from the milk-producing glands. Deciding to breastfeed (nurse) is one of the best choices you can make for you and your baby. The information that follows gives a brief review of the benefits, as well as other important skills to know about breastfeeding. BENEFITS OF BREASTFEEDING For your baby  The first milk (colostrum) helps your baby's digestive system function better.   There are antibodies in your milk that help your baby fight off infections.   Your baby has a lower incidence of asthma, allergies, and sudden infant death syndrome (SIDS).   The nutrients in breast milk are better for your baby than infant formulas.  Breast milk improves your baby's brain development.   Your baby will have less gas, colic, and constipation.  Your baby is less likely to develop other conditions, such as childhood obesity, asthma, or diabetes mellitus. For you  Breastfeeding helps develop a very special bond between you and your baby.   Breastfeeding is convenient, always available at the correct temperature, and costs nothing.   Breastfeeding helps to burn calories and helps you lose the weight gained during pregnancy.   Breastfeeding makes your uterus contract back down to normal size faster and slows bleeding following delivery.   Breastfeeding mothers have a lower risk of developing osteoporosis or breast or ovarian cancer later in life.  BREASTFEEDING FREQUENCY  A healthy, full-term baby may breastfeed as often as every hour or space his or her feedings to every 3 hours. Breastfeeding frequency will vary from baby to baby.   Newborns should be fed no less than every 2 3 hours during the day and every 4 5 hours during the  night. You should breastfeed a minimum of 8 feedings in a 24 hour period.  Awaken your baby to breastfeed if it has been 3 4 hours since the last feeding.  Breastfeed when you feel the need to reduce the fullness of your breasts or when   your newborn shows signs of hunger. Signs that your baby may be hungry include:  Increased alertness or activity.  Stretching.  Movement of the head from side to side.  Movement of the head and opening of the mouth when the corner of the mouth or cheek is stroked (rooting).  Increased sucking sounds, smacking lips, cooing, sighing, or squeaking.  Hand-to-mouth movements.  Increased sucking of fingers or hands.  Fussing.  Intermittent crying.  Signs of extreme hunger will require calming and consoling before you try to feed your baby. Signs of extreme hunger may include:  Restlessness.  A loud, strong cry.  Screaming.  Frequent feeding will help you make more milk and will help prevent problems, such as sore nipples and engorgement of the breasts.  BREASTFEEDING   Whether lying down or sitting, be sure that the baby's abdomen is facing your abdomen.   Support your breast with 4 fingers under your breast and your thumb above your nipple. Make sure your fingers are well away from your nipple and your baby's mouth.   Stroke your baby's lips gently with your finger or nipple.   When your baby's mouth is open wide enough, place all of your nipple and as much of the colored area around your nipple (areola) as possible into your baby's mouth.  More areola should be visible above his or her upper lip than below his or her lower lip.  Your baby's tongue should be between his or her lower gum and your breast.  Ensure that your baby's mouth is correctly positioned around the nipple (latched). Your baby's lips should create a seal on your breast.  Signs that your baby has effectively latched onto your nipple include:  Tugging or sucking  without pain.  Swallowing heard between sucks.  Absent click or smacking sound.  Muscle movement above and in front of his or her ears with sucking.  Your baby must suck about 2 3 minutes in order to get your milk. Allow your baby to feed on each breast as long as he or she wants. Nurse your baby until he or she unlatches or falls asleep at the first breast, then offer the second breast.  Signs that your baby is full and satisfied include:  A gradual decrease in the number of sucks or complete cessation of sucking.  Falling asleep.  Extension or relaxation of his or her body.  Retention of a small amount of milk in his or her mouth.  Letting go of your breast by himself or herself.  Signs of effective breastfeeding in you include:  Breasts that have increased firmness, weight, and size prior to feeding.  Breasts that are softer after nursing.  Increased milk volume, as well as a change in milk consistency and color by the 5th day of breastfeeding.  Breast fullness relieved by breastfeeding.  Nipples are not sore, cracked, or bleeding.  If needed, break the suction by putting your finger into the corner of your baby's mouth and sliding your finger between his or her gums. Then, remove your breast from his or her mouth.  It is common for babies to spit up a small amount after a feeding.  Babies often swallow air during feeding. This can make babies fussy. Burping your baby between breasts can help with this.  Vitamin D supplements are recommended for babies who get only breast milk.  Avoid using a pacifier during your baby's first 4 6 weeks.  Avoid supplemental feedings of water, formula, or   juice in place of breastfeeding. Breast milk is all the food your baby needs. It is not necessary for your baby to have water or formula. Your breasts will make more milk if supplemental feedings are avoided during the early weeks. HOW TO TELL WHETHER YOUR BABY IS GETTING ENOUGH BREAST  MILK Wondering whether or not your baby is getting enough milk is a common concern among mothers. You can be assured that your baby is getting enough milk if:   Your baby is actively sucking and you hear swallowing.   Your baby seems relaxed and satisfied after a feeding.   Your baby nurses at least 8 12 times in a 24 hour time period.  During the first 3 5 days of age:  Your baby is wetting at least 3 5 diapers in a 24 hour period. The urine should be clear and pale yellow.  Your baby is having at least 3 4 stools in a 24 hour period. The stool should be soft and yellow.  At 5 7 days of age, your baby is having at least 3 6 stools in a 24 hour period. The stool should be seedy and yellow by 5 days of age.  Your baby has a weight loss less than 7 10% during the first 3 days of age.  Your baby does not lose weight after 3 7 days of age.  Your baby gains 4 7 ounces each week after he or she is 4 days of age.  Your baby gains weight by 5 days of age and is back to birth weight within 2 weeks. ENGORGEMENT In the first week after your baby is born, you may experience extremely full breasts (engorgement). When engorged, your breasts may feel heavy, warm, or tender to the touch. Engorgement peaks within 24 48 hours after delivery of your baby.  Engorgement may be reduced by:  Continuing to breastfeed.  Increasing the frequency of breastfeeding.  Taking warm showers or applying warm, moist heat to your breasts just before each feeding. This increases circulation and helps the milk flow.   Gently massaging your breast before and during the feedings. With your fingertips, massage from your chest wall towards your nipple in a circular motion.   Ensuring that your baby empties at least one breast at every feeding. It also helps to start the next feeding on the opposite breast.   Expressing breast milk by hand or by using a breast pump to empty the breasts if your baby is sleepy, or  not nursing well. You may also want to express milk if you are returning to work oryou feel you are getting engorged.  Ensuring your baby is latched on and positioned properly while breastfeeding. If you follow these suggestions, your engorgement should improve in 24 48 hours. If you are still experiencing difficulty, call your lactation consultant or caregiver.  CARING FOR YOURSELF Take care of your breasts.  Bathe or shower daily.   Avoid using soap on your nipples.   Wear a supportive bra. Avoid wearing underwire style bras.  Air dry your nipples for a 3 4minutes after each feeding.   Use only cotton bra pads to absorb breast milk leakage. Leaking of breast milk between feedings is normal.   Use only pure lanolin on your nipples after nursing. You do not need to wash it off before feeding your baby again. Another option is to express a few drops of breast milk and gently massage that milk into your nipples.  Continue   breast self-awareness checks. Take care of yourself.  Eat healthy foods. Alternate 3 meals with 3 snacks.  Avoid foods that you notice affect your baby in a bad way.  Drink milk, fruit juice, and water to satisfy your thirst (about 8 glasses a day).   Rest often, relax, and take your prenatal vitamins to prevent fatigue, stress, and anemia.  Avoid chewing and smoking tobacco.  Avoid alcohol and drug use.  Take over-the-counter and prescribed medicine only as directed by your caregiver or pharmacist. You should always check with your caregiver or pharmacist before taking any new medicine, vitamin, or herbal supplement.  Know that pregnancy is possible while breastfeeding. If desired, talk to your caregiver about family planning and safe birth control methods that may be used while breastfeeding. SEEK MEDICAL CARE IF:   You feel like you want to stop breastfeeding or have become frustrated with breastfeeding.  You have painful breasts or nipples.  Your  nipples are cracked or bleeding.  Your breasts are red, tender, or warm.  You have a swollen area on either breast.  You have a fever or chills.  You have nausea or vomiting.  You have drainage from your nipples.  Your breasts do not become full before feedings by the 5th day after delivery.  You feel sad and depressed.  Your baby is too sleepy to eat well.  Your baby is having trouble sleeping.   Your baby is wetting less than 3 diapers in a 24 hour period.  Your baby has less than 3 stools in a 24 hour period.  Your baby's skin or the white part of his or her eyes becomes more yellow.   Your baby is not gaining weight by 5 days of age. MAKE SURE YOU:   Understand these instructions.  Will watch your condition.  Will get help right away if you are not doing well or get worse. Document Released: 11/21/2005 Document Revised: 08/15/2012 Document Reviewed: 06/27/2012 [employer] Patient Information 2014 [employer].  

## 2013-07-11 NOTE — Progress Notes (Signed)
   Subjective:    Barbara Heath is a G3P1011 [redacted]w[redacted]d being seen today for her first obstetrical visit.  Her obstetrical history is significant for previously normal pregnancy and delivery. Has had regular pap smears with Dr. Chilton Si in [facility].  No h/o abnls.. Patient does intend to breast feed. Pregnancy history fully reviewed.  Patient reports no complaints.  Filed Vitals:   07/11/13 1046  BP: 107/70  Temp: 97.2 F (36.2 C)  Weight: 163 lb 9.6 oz (74.208 kg)    HISTORY: OB History   Grav Para Term Preterm Abortions TAB SAB Ect Mult Living   3 1 1  0 1  1   1      # Outc Date GA Lbr Len/2nd Wgt Sex Del Anes PTL Lv   1 TRM 5/08 [redacted]w[redacted]d  7lb12oz(3.515kg) M SVD EPI  Yes   Comments: "urine would back up into kidneys", polyhydraminos   2 SAB 4/14           3 CUR              Past Medical History  Diagnosis Date  . Complication of anesthesia     pt reported shaking alot   . Sickle cell trait    Past Surgical History  Procedure Laterality Date  . Wisdom tooth extraction     Family History  Problem Relation Age of Onset  . COPD Paternal Grandmother   . Kidney disease Mother      Exam     Skin: normal coloration and turgor, no rashes    Neurologic: normal   Extremities: normal strength, tone, and muscle mass   HEENT sclera clear, anicteric   Mouth/Teeth mucous membranes moist, pharynx normal without lesions   Neck supple   Cardiovascular: regular rate and rhythm, no murmurs or gallops   Respiratory:  appears well, vitals normal, no respiratory distress, acyanotic, normal RR, ear and throat exam is normal, neck free of mass or lymphadenopathy, chest clear, no wheezing, crepitations, rhonchi, normal symmetric air entry   Abdomen: soft, non-tender; bowel sounds normal; no masses,  no organomegaly      Assessment:    Pregnancy: [id number] Patient Active Problem List   Diagnosis Date Noted  . Supervision of other normal pregnancy 07/11/2013        Plan:      Initial labs drawn. Prenatal vitamins. Problem list reviewed and updated. Genetic Screening discussed First Screen: requested.  Ultrasound discussed; fetal survey: discussed.  Follow up in 4 weeks. New OB labs.     PRATT,TANYA S 07/11/2013

## 2013-07-12 ENCOUNTER — Encounter: Payer: Self-pay | Admitting: *Deleted

## 2013-07-12 DIAGNOSIS — D573 Sickle-cell trait: Secondary | ICD-10-CM | POA: Insufficient documentation

## 2013-07-12 LAB — OBSTETRIC PANEL
Antibody Screen: NEGATIVE
Basophils Absolute: 0 10*3/uL (ref 0.0–0.1)
Eosinophils Absolute: 0.2 10*3/uL (ref 0.0–0.7)
Eosinophils Relative: 3 % (ref 0–5)
HCT: 36.1 % (ref 36.0–46.0)
Lymphocytes Relative: 32 % (ref 12–46)
MCH: 27.6 pg (ref 26.0–34.0)
MCV: 82.4 fL (ref 78.0–100.0)
Monocytes Absolute: 0.7 10*3/uL (ref 0.1–1.0)
Platelets: 252 10*3/uL (ref 150–400)
RDW: 14.1 % (ref 11.5–15.5)
Rubella: 2.68 Index — ABNORMAL HIGH (ref <0.90)
WBC: 6.7 10*3/uL (ref 4.0–10.5)

## 2013-07-12 LAB — CULTURE, OB URINE: Colony Count: NO GROWTH

## 2013-07-15 ENCOUNTER — Other Ambulatory Visit: Payer: Self-pay | Admitting: Family Medicine

## 2013-07-15 ENCOUNTER — Ambulatory Visit (HOSPITAL_COMMUNITY)
Admission: RE | Admit: 2013-07-15 | Discharge: 2013-07-15 | Disposition: A | Discharge status: Home / Self Care | Payer: Medicaid Other | Source: Ambulatory Visit | Attending: Family Medicine | Admitting: Family Medicine

## 2013-07-15 ENCOUNTER — Ambulatory Visit (HOSPITAL_COMMUNITY): Admission: RE | Admit: 2013-07-15 | Payer: Medicaid Other | Source: Ambulatory Visit

## 2013-07-15 DIAGNOSIS — Z3689 Encounter for other specified antenatal screening: Secondary | ICD-10-CM | POA: Insufficient documentation

## 2013-07-15 DIAGNOSIS — Z3481 Encounter for supervision of other normal pregnancy, first trimester: Secondary | ICD-10-CM

## 2013-07-15 DIAGNOSIS — O3510X0 Maternal care for (suspected) chromosomal abnormality in fetus, unspecified, not applicable or unspecified: Secondary | ICD-10-CM | POA: Insufficient documentation

## 2013-07-15 DIAGNOSIS — O351XX0 Maternal care for (suspected) chromosomal abnormality in fetus, not applicable or unspecified: Secondary | ICD-10-CM | POA: Insufficient documentation

## 2013-08-08 ENCOUNTER — Encounter: Payer: Self-pay | Admitting: Family

## 2013-08-08 ENCOUNTER — Ambulatory Visit (INDEPENDENT_AMBULATORY_CARE_PROVIDER_SITE_OTHER): Payer: Medicaid Other | Admitting: Family

## 2013-08-08 VITALS — BP 111/73 | Temp 97.6°F | Wt 169.0 lb

## 2013-08-08 DIAGNOSIS — D573 Sickle-cell trait: Secondary | ICD-10-CM

## 2013-08-08 DIAGNOSIS — Z3480 Encounter for supervision of other normal pregnancy, unspecified trimester: Secondary | ICD-10-CM

## 2013-08-08 DIAGNOSIS — Z3482 Encounter for supervision of other normal pregnancy, second trimester: Secondary | ICD-10-CM

## 2013-08-08 DIAGNOSIS — Z3492 Encounter for supervision of normal pregnancy, unspecified, second trimester: Secondary | ICD-10-CM

## 2013-08-08 LAB — POCT URINALYSIS DIP (DEVICE)
Glucose, UA: NEGATIVE mg/dL
Hgb urine dipstick: NEGATIVE
Leukocytes, UA: NEGATIVE
Nitrite: NEGATIVE
Urobilinogen, UA: 1 mg/dL (ref 0.0–1.0)
pH: 7 (ref 5.0–8.0)

## 2013-08-08 NOTE — Progress Notes (Signed)
Pt reports not having an appetite.  Pain-cramps  Pressure-lower abd Pulse- 90

## 2013-08-08 NOTE — Progress Notes (Signed)
U/S scheduled 08/15/13 at 1030 am.

## 2013-08-08 NOTE — Progress Notes (Signed)
Reviewed OB labs; scheduled anatomy ultrasound. Obtain quad today.

## 2013-08-14 ENCOUNTER — Telehealth: Payer: Self-pay | Admitting: General Practice

## 2013-08-14 DIAGNOSIS — Z3490 Encounter for supervision of normal pregnancy, unspecified, unspecified trimester: Secondary | ICD-10-CM

## 2013-08-14 NOTE — Telephone Encounter (Signed)
Called patient, no answer- left message stating we are just trying to reach you, you can call us back at the clinic in the morning or come by our office in the morning after her appt, nothing urgent we just wanted to speak with you.

## 2013-08-14 NOTE — Telephone Encounter (Signed)
Message copied by Kathee Delton on Wed Aug 14, 2013  4:34 PM ------      Message from: Melissa Noon      Created: Wed Aug 14, 2013  8:24 AM       Can we bring this patient in for a Quad draw?            ----- Message -----         From: SYSTEM         Sent: 08/13/2013  12:08 AM           To: Melissa Noon, CNM                   ------

## 2013-08-15 ENCOUNTER — Ambulatory Visit (HOSPITAL_COMMUNITY)
Admission: RE | Admit: 2013-08-15 | Discharge: 2013-08-15 | Disposition: A | Discharge status: Home / Self Care | Payer: Medicaid Other | Source: Ambulatory Visit | Attending: Family | Admitting: Family

## 2013-08-15 ENCOUNTER — Other Ambulatory Visit: Payer: Self-pay | Admitting: Family

## 2013-08-15 DIAGNOSIS — Z3492 Encounter for supervision of normal pregnancy, unspecified, second trimester: Secondary | ICD-10-CM

## 2013-08-15 DIAGNOSIS — Z3689 Encounter for other specified antenatal screening: Secondary | ICD-10-CM | POA: Insufficient documentation

## 2013-08-16 ENCOUNTER — Other Ambulatory Visit: Payer: Self-pay | Admitting: Family

## 2013-08-16 ENCOUNTER — Encounter: Payer: Self-pay | Admitting: Family

## 2013-08-16 DIAGNOSIS — Z3492 Encounter for supervision of normal pregnancy, unspecified, second trimester: Secondary | ICD-10-CM

## 2013-08-19 NOTE — Telephone Encounter (Signed)
Called and spoke w/pt. I asked if she would come to the clinic for lab draw for Quad screen.  Pt stated that she has already had this test done.  Upon review I confirmed that pt had test done on 08/08/13. I apologized for the confusion and advised pt that her specimen has been sent to the lab and we do not have results as of yet.  Pt voiced understanding.

## 2013-08-22 ENCOUNTER — Telehealth: Payer: Self-pay | Admitting: General Practice

## 2013-08-22 NOTE — Telephone Encounter (Signed)
Patient called and left message stating she has been sick for 4 days with coughing, nose stuffiness, chills and body aches and nothing has helped that she has taken over the counter and wants to know if we can call something in. Called patient back and she stated it felt like a bad cold but she did not have fever or anything. Discussed list of approved cold/cough medications with patient. Patient verbalized understanding and had no further questions

## 2013-09-05 ENCOUNTER — Ambulatory Visit (INDEPENDENT_AMBULATORY_CARE_PROVIDER_SITE_OTHER): Payer: Medicaid Other | Admitting: Obstetrics and Gynecology

## 2013-09-05 ENCOUNTER — Encounter: Payer: Self-pay | Admitting: Obstetrics and Gynecology

## 2013-09-05 VITALS — BP 117/75 | Temp 97.1°F | Wt 173.6 lb

## 2013-09-05 DIAGNOSIS — Z3480 Encounter for supervision of other normal pregnancy, unspecified trimester: Secondary | ICD-10-CM

## 2013-09-05 DIAGNOSIS — D573 Sickle-cell trait: Secondary | ICD-10-CM

## 2013-09-05 DIAGNOSIS — Z3482 Encounter for supervision of other normal pregnancy, second trimester: Secondary | ICD-10-CM

## 2013-09-05 LAB — POCT URINALYSIS DIP (DEVICE)
Leukocytes, UA: NEGATIVE
Nitrite: NEGATIVE
Protein, ur: NEGATIVE mg/dL
pH: 7 (ref 5.0–8.0)

## 2013-09-05 NOTE — Progress Notes (Signed)
P= 89 Pt. Reports lower abdominal/pelvic pressure.

## 2013-09-05 NOTE — Progress Notes (Signed)
U/S scheduled 09/10/13 at 915 am.

## 2013-09-05 NOTE — Progress Notes (Signed)
Patient doing well without complaints. F/U anatomy ultrasound ordered today

## 2013-09-06 LAB — GC/CHLAMYDIA PROBE AMP: CT Probe RNA: NEGATIVE

## 2013-09-10 ENCOUNTER — Ambulatory Visit (HOSPITAL_COMMUNITY): Admission: RE | Admit: 2013-09-10 | Payer: Medicaid Other | Source: Ambulatory Visit

## 2013-09-10 ENCOUNTER — Ambulatory Visit (HOSPITAL_COMMUNITY)
Admission: RE | Admit: 2013-09-10 | Discharge: 2013-09-10 | Disposition: A | Discharge status: Home / Self Care | Payer: Medicaid Other | Source: Ambulatory Visit | Attending: Obstetrics and Gynecology | Admitting: Obstetrics and Gynecology

## 2013-09-10 ENCOUNTER — Encounter: Payer: Self-pay | Admitting: *Deleted

## 2013-09-10 DIAGNOSIS — Z3482 Encounter for supervision of other normal pregnancy, second trimester: Secondary | ICD-10-CM

## 2013-09-10 DIAGNOSIS — Z3689 Encounter for other specified antenatal screening: Secondary | ICD-10-CM | POA: Insufficient documentation

## 2013-09-10 DIAGNOSIS — Z3480 Encounter for supervision of other normal pregnancy, unspecified trimester: Secondary | ICD-10-CM

## 2013-09-11 ENCOUNTER — Encounter: Payer: Self-pay | Admitting: Obstetrics and Gynecology

## 2013-10-04 ENCOUNTER — Ambulatory Visit (INDEPENDENT_AMBULATORY_CARE_PROVIDER_SITE_OTHER): Payer: Medicaid Other | Admitting: Obstetrics & Gynecology

## 2013-10-04 VITALS — BP 115/76 | Temp 96.7°F | Wt 182.6 lb

## 2013-10-04 DIAGNOSIS — Z3482 Encounter for supervision of other normal pregnancy, second trimester: Secondary | ICD-10-CM

## 2013-10-04 DIAGNOSIS — D573 Sickle-cell trait: Secondary | ICD-10-CM

## 2013-10-04 DIAGNOSIS — Z3480 Encounter for supervision of other normal pregnancy, unspecified trimester: Secondary | ICD-10-CM

## 2013-10-04 LAB — POCT URINALYSIS DIP (DEVICE)
Hgb urine dipstick: NEGATIVE
Nitrite: NEGATIVE
Protein, ur: NEGATIVE mg/dL
Urobilinogen, UA: 0.2 mg/dL (ref 0.0–1.0)
pH: 7 (ref 5.0–8.0)

## 2013-10-04 NOTE — Progress Notes (Signed)
P= 96 Pt. C/o of pelvic/vaginal pressure.

## 2013-10-04 NOTE — Progress Notes (Signed)
[place] 10/7 51%ile,normal female.

## 2013-10-04 NOTE — Patient Instructions (Signed)
Pregnancy - Second Trimester The second trimester of pregnancy (3 to 6 months) is a period of rapid growth for you and your baby. At the end of the sixth month, your baby is about 9 inches long and weighs 1 1/2 pounds. You will begin to feel the baby move between 18 and 20 weeks of the pregnancy. This is called quickening. Weight gain is faster. A clear fluid (colostrum) may leak out of your breasts. You may feel small contractions of the womb (uterus). This is known as false labor or Braxton-Hicks contractions. This is like a practice for labor when the baby is ready to be born. Usually, the problems with morning sickness have usually passed by the end of your first trimester. Some women develop small dark blotches (called cholasma, mask of pregnancy) on their face that usually goes away after the baby is born. Exposure to the sun makes the blotches worse. Acne may also develop in some pregnant women and pregnant women who have acne, may find that it goes away. PRENATAL EXAMS  Blood work may continue to be done during prenatal exams. These tests are done to check on your health and the probable health of your baby. Blood work is used to follow your blood levels (hemoglobin). Anemia (low hemoglobin) is common during pregnancy. Iron and vitamins are given to help prevent this. You will also be checked for diabetes between 24 and 28 weeks of the pregnancy. Some of the previous blood tests may be repeated.  The size of the uterus is measured during each visit. This is to make sure that the baby is continuing to grow properly according to the dates of the pregnancy.  Your blood pressure is checked every prenatal visit. This is to make sure you are not getting toxemia.  Your urine is checked to make sure you do not have an infection, diabetes or protein in the urine.  Your weight is checked often to make sure gains are happening at the suggested rate. This is to ensure that both you and your baby are  growing normally.  Sometimes, an ultrasound is performed to confirm the proper growth and development of the baby. This is a test which bounces harmless sound waves off the baby so your caregiver can more accurately determine due dates. Sometimes, a test is done on the amniotic fluid surrounding the baby. This test is called an amniocentesis. The amniotic fluid is obtained by sticking a needle into the belly (abdomen). This is done to check the chromosomes in instances where there is a concern about possible genetic problems with the baby. It is also sometimes done near the end of pregnancy if an early delivery is required. In this case, it is done to help make sure the baby's lungs are mature enough for the baby to live outside of the womb. CHANGES OCCURING IN THE SECOND TRIMESTER OF PREGNANCY Your body goes through many changes during pregnancy. They vary from person to person. Talk to your caregiver about changes you notice that you are concerned about.  During the second trimester, you will likely have an increase in your appetite. It is normal to have cravings for certain foods. This varies from person to person and pregnancy to pregnancy.  Your lower abdomen will begin to bulge.  You may have to urinate more often because the uterus and baby are pressing on your bladder. It is also common to get more bladder infections during pregnancy. You can help this by drinking lots of fluids   and emptying your bladder before and after intercourse.  You may begin to get stretch marks on your hips, abdomen, and breasts. These are normal changes in the body during pregnancy. There are no exercises or medicines to take that prevent this change.  You may begin to develop swollen and bulging veins (varicose veins) in your legs. Wearing support hose, elevating your feet for 15 minutes, 3 to 4 times a day and limiting salt in your diet helps lessen the problem.  Heartburn may develop as the uterus grows and  pushes up against the stomach. Antacids recommended by your caregiver helps with this problem. Also, eating smaller meals 4 to 5 times a day helps.  Constipation can be treated with a stool softener or adding bulk to your diet. Drinking lots of fluids, and eating vegetables, fruits, and whole grains are helpful.  Exercising is also helpful. If you have been very active up until your pregnancy, most of these activities can be continued during your pregnancy. If you have been less active, it is helpful to start an exercise program such as walking.  Hemorrhoids may develop at the end of the second trimester. Warm sitz baths and hemorrhoid cream recommended by your caregiver helps hemorrhoid problems.  Backaches may develop during this time of your pregnancy. Avoid heavy lifting, wear low heal shoes, and practice good posture to help with backache problems.  Some pregnant women develop tingling and numbness of their hand and fingers because of swelling and tightening of ligaments in the wrist (carpel tunnel syndrome). This goes away after the baby is born.  As your breasts enlarge, you may have to get a bigger bra. Get a comfortable, cotton, support bra. Do not get a nursing bra until the last month of the pregnancy if you will be nursing the baby.  You may get a dark line from your belly button to the pubic area called the linea nigra.  You may develop rosy cheeks because of increase blood flow to the face.  You may develop spider looking lines of the face, neck, arms, and chest. These go away after the baby is born. HOME CARE INSTRUCTIONS   It is extremely important to avoid all smoking, herbs, alcohol, and unprescribed drugs during your pregnancy. These chemicals affect the formation and growth of the baby. Avoid these chemicals throughout the pregnancy to ensure the delivery of a healthy infant.  Most of your home care instructions are the same as suggested for the first trimester of your  pregnancy. Keep your caregiver's appointments. Follow your caregiver's instructions regarding medicine use, exercise, and diet.  During pregnancy, you are providing food for you and your baby. Continue to eat regular, well-balanced meals. Choose foods such as meat, fish, milk and other low fat dairy products, vegetables, fruits, and whole-grain breads and cereals. Your caregiver will tell you of the ideal weight gain.  A physical sexual relationship may be continued up until near the end of pregnancy if there are no other problems. Problems could include early (premature) leaking of amniotic fluid from the membranes, vaginal bleeding, abdominal pain, or other medical or pregnancy problems.  Exercise regularly if there are no restrictions. Check with your caregiver if you are unsure of the safety of some of your exercises. The greatest weight gain will occur in the last 2 trimesters of pregnancy. Exercise will help you:  Control your weight.  Get you in shape for labor and delivery.  Lose weight after you have the baby.  Wear   a good support or jogging bra for breast tenderness during pregnancy. This may help if worn during sleep. Pads or tissues may be used in the bra if you are leaking colostrum.  Do not use hot tubs, steam rooms or saunas throughout the pregnancy.  Wear your seat belt at all times when driving. This protects you and your baby if you are in an accident.  Avoid raw meat, uncooked cheese, cat litter boxes, and soil used by cats. These carry germs that can cause birth defects in the baby.  The second trimester is also a good time to visit your dentist for your dental health if this has not been done yet. Getting your teeth cleaned is okay. Use a soft toothbrush. Brush gently during pregnancy.  It is easier to leak urine during pregnancy. Tightening up and strengthening the pelvic muscles will help with this problem. Practice stopping your urination while you are going to the  bathroom. These are the same muscles you need to strengthen. It is also the muscles you would use as if you were trying to stop from passing gas. You can practice tightening these muscles up 10 times a set and repeating this about 3 times per day. Once you know what muscles to tighten up, do not perform these exercises during urination. It is more likely to contribute to an infection by backing up the urine.  Ask for help if you have financial, counseling, or nutritional needs during pregnancy. Your caregiver will be able to offer counseling for these needs as well as refer you for other special needs.  Your skin may become oily. If so, wash your face with mild soap, use non-greasy moisturizer and oil or cream based makeup. MEDICINES AND DRUG USE IN PREGNANCY  Take prenatal vitamins as directed. The vitamin should contain 1 milligram of folic acid. Keep all vitamins out of reach of children. Only a couple vitamins or tablets containing iron may be fatal to a baby or young child when ingested.  Avoid use of all medicines, including herbs, over-the-counter medicines, not prescribed or suggested by your caregiver. Only take over-the-counter or prescription medicines for pain, discomfort, or fever as directed by your caregiver. Do not use aspirin.  Let your caregiver also know about herbs you may be using.  Alcohol is related to a number of birth defects. This includes fetal alcohol syndrome. All alcohol, in any form, should be avoided completely. Smoking will cause low birth rate and premature babies.  Street or illegal drugs are very harmful to the baby. They are absolutely forbidden. A baby born to an addicted mother will be addicted at birth. The baby will go through the same withdrawal an adult does. SEEK MEDICAL CARE IF:  You have any concerns or worries during your pregnancy. It is better to call with your questions if you feel they cannot wait, rather than worry about them. SEEK IMMEDIATE  MEDICAL CARE IF:   An unexplained oral temperature above 102 F (38.9 C) develops, or as your caregiver suggests.  You have leaking of fluid from the vagina (birth canal). If leaking membranes are suspected, take your temperature and tell your caregiver of this when you call.  There is vaginal spotting, bleeding, or passing clots. Tell your caregiver of the amount and how many pads are used. Light spotting in pregnancy is common, especially following intercourse.  You develop a bad smelling vaginal discharge with a change in the color from clear to white.  You continue to feel   sick to your stomach (nauseated) and have no relief from remedies suggested. You vomit blood or coffee ground-like materials.  You lose more than 2 pounds of weight or gain more than 2 pounds of weight over 1 week, or as suggested by your caregiver.  You notice swelling of your face, hands, feet, or legs.  You get exposed to German measles and have never had them.  You are exposed to fifth disease or chickenpox.  You develop belly (abdominal) pain. Round ligament discomfort is a common non-cancerous (benign) cause of abdominal pain in pregnancy. Your caregiver still must evaluate you.  You develop a bad headache that does not go away.  You develop fever, diarrhea, pain with urination, or shortness of breath.  You develop visual problems, blurry, or double vision.  You fall or are in a car accident or any kind of trauma.  There is mental or physical violence at home. Document Released: 11/15/2001 Document Revised: 08/15/2012 Document Reviewed: 05/20/2009 [employer] Patient Information 2014 [employer].  

## 2013-10-10 ENCOUNTER — Other Ambulatory Visit: Payer: Self-pay

## 2013-10-25 ENCOUNTER — Encounter: Payer: Medicaid Other | Admitting: Advanced Practice Midwife

## 2013-11-25 ENCOUNTER — Encounter: Payer: Self-pay | Admitting: Obstetrics and Gynecology

## 2013-11-25 ENCOUNTER — Ambulatory Visit (INDEPENDENT_AMBULATORY_CARE_PROVIDER_SITE_OTHER): Payer: Medicaid Other | Admitting: Obstetrics and Gynecology

## 2013-11-25 VITALS — BP 125/77 | Wt 194.0 lb

## 2013-11-25 DIAGNOSIS — D573 Sickle-cell trait: Secondary | ICD-10-CM

## 2013-11-25 DIAGNOSIS — Z3483 Encounter for supervision of other normal pregnancy, third trimester: Secondary | ICD-10-CM

## 2013-11-25 DIAGNOSIS — Z3480 Encounter for supervision of other normal pregnancy, unspecified trimester: Secondary | ICD-10-CM

## 2013-11-25 LAB — POCT URINALYSIS DIP (DEVICE)
Ketones, ur: NEGATIVE mg/dL
Leukocytes, UA: NEGATIVE
Nitrite: NEGATIVE
Protein, ur: NEGATIVE mg/dL
Urobilinogen, UA: 1 mg/dL (ref 0.0–1.0)
pH: 7 (ref 5.0–8.0)

## 2013-11-25 NOTE — Progress Notes (Signed)
Pulse- 98 

## 2013-11-25 NOTE — Progress Notes (Signed)
Patient doing well with a lot of third trimester complaints: swelling in hands and feet, back aches, BHC. Reassurance provided. FM/PTL precautions reviewed.

## 2013-11-26 ENCOUNTER — Encounter: Payer: Self-pay | Admitting: Obstetrics and Gynecology

## 2013-12-05 NOTE — L&D Delivery Note (Signed)
Delivery Note At 9:25 AM a viable female was delivered via Vaginal, Spontaneous Delivery (Presentation: Left Occiput Anterior).  APGAR: crying at perineum weight .   Placenta status: Intact, Spontaneous.  Cord: 3 vessels with the following complications: None.  Cord pH: NA  Anesthesia:   Episiotomy:  Lacerations:  Suture Repair: NA Est. Blood Loss (mL): 300  Mom to postpartum.  Baby to Couplet care / Skin to Skin.  Rapid active labor with 3 pushes delivered NSVD over intact pernieum. Active management of 3rd stage of labor with pit and traction. Delivered intact placenta with 3v cord. EBL 300. Counts correct, hemostatic. Inadequate ABx for GBS+ status 2/2 rapid progression. Infant to maternal chest.  Rebeccah Ivins RYAN 01/11/2014, 9:37 AM

## 2013-12-06 ENCOUNTER — Inpatient Hospital Stay (HOSPITAL_COMMUNITY)
Admission: AD | Admit: 2013-12-06 | Discharge: 2013-12-06 | Disposition: A | Discharge status: Home / Self Care | Payer: Medicaid Other | Source: Ambulatory Visit | Attending: Obstetrics and Gynecology | Admitting: Obstetrics and Gynecology

## 2013-12-06 ENCOUNTER — Encounter (HOSPITAL_COMMUNITY): Payer: Self-pay | Admitting: *Deleted

## 2013-12-06 DIAGNOSIS — Z3483 Encounter for supervision of other normal pregnancy, third trimester: Secondary | ICD-10-CM

## 2013-12-06 DIAGNOSIS — R1084 Generalized abdominal pain: Secondary | ICD-10-CM

## 2013-12-06 DIAGNOSIS — O4700 False labor before 37 completed weeks of gestation, unspecified trimester: Secondary | ICD-10-CM

## 2013-12-06 DIAGNOSIS — R109 Unspecified abdominal pain: Secondary | ICD-10-CM | POA: Insufficient documentation

## 2013-12-06 DIAGNOSIS — O4703 False labor before 37 completed weeks of gestation, third trimester: Secondary | ICD-10-CM

## 2013-12-06 LAB — WET PREP, GENITAL
Clue Cells Wet Prep HPF POC: NONE SEEN
TRICH WET PREP: NONE SEEN
YEAST WET PREP: NONE SEEN

## 2013-12-06 LAB — URINALYSIS, ROUTINE W REFLEX MICROSCOPIC
Bilirubin Urine: NEGATIVE
GLUCOSE, UA: NEGATIVE mg/dL
HGB URINE DIPSTICK: NEGATIVE
Ketones, ur: NEGATIVE mg/dL
LEUKOCYTES UA: NEGATIVE
Nitrite: NEGATIVE
Protein, ur: NEGATIVE mg/dL
SPECIFIC GRAVITY, URINE: 1.01 (ref 1.005–1.030)
UROBILINOGEN UA: 0.2 mg/dL (ref 0.0–1.0)
pH: 6 (ref 5.0–8.0)

## 2013-12-06 NOTE — MAU Note (Addendum)
abd pain- tightening past 2 days, pelvic pressure, numbness in legs.  No bleeding, no leaking.  Hx of PTL with first preg.

## 2013-12-06 NOTE — MAU Provider Note (Signed)
Attestation of Attending Supervision of Advanced Practitioner (CNM/NP): Evaluation and management procedures were performed by the Advanced Practitioner under my supervision and collaboration.  I have reviewed the Advanced Practitioner's note and chart, and I agree with the management and plan.  Qaadir Kent 12/06/2013 10:39 PM   

## 2013-12-06 NOTE — Discharge Instructions (Signed)
Preterm Labor Information Preterm labor is when labor starts at less than 37 weeks of pregnancy. The normal length of a pregnancy is 39 to 41 weeks. CAUSES Often, there is no identifiable underlying cause as to why a woman goes into preterm labor. One of the most common known causes of preterm labor is infection. Infections of the uterus, cervix, vagina, amniotic sac, bladder, kidney, or even the lungs (pneumonia) can cause labor to start. Other suspected causes of preterm labor include:   Urogenital infections, such as yeast infections and bacterial vaginosis.   Uterine abnormalities (uterine shape, uterine septum, fibroids, or bleeding from the placenta).   A cervix that has been operated on (it may fail to stay closed).   Malformations in the fetus.   Multiple gestations (twins, triplets, and so on).   Breakage of the amniotic sac.  RISK FACTORS  Having a previous history of preterm labor.   Having premature rupture of membranes (PROM).   Having a placenta that covers the opening of the cervix (placenta previa).   Having a placenta that separates from the uterus (placental abruption).   Having a cervix that is too weak to hold the fetus in the uterus (incompetent cervix).   Having too much fluid in the amniotic sac (polyhydramnios).   Taking illegal drugs or smoking while pregnant.   Not gaining enough weight while pregnant.   Being younger than 18 and older than 30 years old.   Having a low socioeconomic status.   Being African American. SYMPTOMS Signs and symptoms of preterm labor include:   Menstrual-like cramps, abdominal pain, or back pain.  Uterine contractions that are regular, as frequent as six in an hour, regardless of their intensity (may be mild or painful).  Contractions that start on the top of the uterus and spread down to the lower abdomen and back.   A sense of increased pelvic pressure.   A watery or bloody mucus discharge that  comes from the vagina.  TREATMENT Depending on the length of the pregnancy and other circumstances, your health care provider may suggest bed rest. If necessary, there are medicines that can be given to stop contractions and to mature the fetal lungs. If labor happens before 34 weeks of pregnancy, a prolonged hospital stay may be recommended. Treatment depends on the condition of both you and the fetus.  WHAT SHOULD YOU DO IF YOU THINK YOU ARE IN PRETERM LABOR? Call your health care provider right away. You will need to go to the hospital to get checked immediately. HOW CAN YOU PREVENT PRETERM LABOR IN FUTURE PREGNANCIES? You should:   Stop smoking if you smoke.  Maintain healthy weight gain and avoid chemicals and drugs that are not necessary.  Be watchful for any type of infection.  Inform your health care provider if you have a known history of preterm labor. Document Released: 02/11/2004 Document Revised: 07/24/2013 Document Reviewed: 12/24/2012 [employer] Patient Information 2014 [employer].    

## 2013-12-06 NOTE — MAU Provider Note (Signed)
None     Chief Complaint:  Abdominal Pain   Barbara Heath is  28 y.o. G3P1011 at 218w3d presents complaining of Abdominal Pain .  She states regular, every 3 minutes contractions are associated with none vaginal bleeding, intact membranes, along with active fetal movement.  Pt reports that she has been having painful contractions since christmas. Pt states that they wax and wane in intensity but have been worse the last 2 days after intercourse. Pt states that baby is moving well, no LOF, no VB. Of note, pt's first pregnacny did require steroid injections for Preterm contractions but delivered at 39wks.  Obstetrical/Gynecological History: OB History   Grav Para Term Preterm Abortions TAB SAB Ect Mult Living   3 1 1  0 1  1   1      Past Medical History: Past Medical History  Diagnosis Date  . Complication of anesthesia     pt reported shaking alot   . Sickle cell trait     Past Surgical History: Past Surgical History  Procedure Laterality Date  . Wisdom tooth extraction      Family History: Family History  Problem Relation Age of Onset  . COPD Paternal Grandmother   . Kidney disease Mother     Social History: History  Substance Use Topics  . Smoking status: Never Smoker   . Smokeless tobacco: Never Used  . Alcohol Use: No     Comment: Socially, drinks wine    Allergies: No Known Allergies  Meds:  Prescriptions prior to admission  Medication Sig Dispense Refill  . Prenatal Vit-Fe Fumarate-FA (PRENATAL MULTIVITAMIN) TABS Take 1 tablet by mouth daily at 12 noon.        Review of Systems -   Review of Systems  Constitutional: Negative for fever, chills, weight loss, malaise/fatigue and diaphoresis.  HENT: Negative for hearing loss, ear pain, nosebleeds, congestion, sore throat, neck pain, tinnitus and ear discharge.   Eyes: Negative for blurred vision, double vision, photophobia, pain, discharge and redness.  Respiratory: Negative for cough, hemoptysis,  sputum production, shortness of breath, wheezing and stridor.   Cardiovascular: Negative for chest pain, palpitations, orthopnea,  leg swelling  Gastrointestinal: Negative for abdominal pain heartburn, nausea, vomiting, diarrhea, constipation, blood in stool Genitourinary: Negative for dysuria, urgency, frequency, hematuria and flank pain.  Musculoskeletal: Negative for myalgias, back pain, joint pain and falls.  Skin: Negative for itching and rash.  Neurological: Negative for dizziness, tingling, tremors, sensory change, speech change, focal weakness, seizures, loss of consciousness, weakness and headaches.  Endo/Heme/Allergies: Negative for environmental allergies and polydipsia. Does not bruise/bleed easily.  Psychiatric/Behavioral: Negative for depression, suicidal ideas, hallucinations, memory loss and substance abuse. The patient is not nervous/anxious and does not have insomnia.      Physical Exam  Blood pressure 125/86, pulse 103, temperature 98.5 F (36.9 C), temperature source Oral, resp. rate 18, height 5\' 4"  (1.626 m), weight 89.812 kg (198 lb), last menstrual period 04/09/2013. GENERAL: Well-developed, well-nourished female in no acute distress. Able to talk through contraxctions Dilation: 1 Effacement (%): 30 Station: -3 Presentation: Vertex Exam by:: Dr. Ike Benedom  Presentation: cephalic FHT:  Baseline rate 140s bpm   Variability moderate  Accelerations present   Decelerations none Contractions: Every 3-5 mins   Labs: Results for orders placed during the hospital encounter of 12/06/13 (from the past 24 hour(s))  WET PREP, GENITAL   Collection Time    12/06/13  6:05 PM      Result Value Range  Yeast Wet Prep HPF POC NONE SEEN  NONE SEEN   Trich, Wet Prep NONE SEEN  NONE SEEN   Clue Cells Wet Prep HPF POC NONE SEEN  NONE SEEN   WBC, Wet Prep HPF POC FEW (*) NONE SEEN  URINALYSIS, ROUTINE W REFLEX MICROSCOPIC   Collection Time    12/06/13  6:07 PM      Result Value  Range   Color, Urine YELLOW  YELLOW   APPearance CLEAR  CLEAR   Specific Gravity, Urine 1.010  1.005 - 1.030   pH 6.0  5.0 - 8.0   Glucose, UA NEGATIVE  NEGATIVE mg/dL   Hgb urine dipstick NEGATIVE  NEGATIVE   Bilirubin Urine NEGATIVE  NEGATIVE   Ketones, ur NEGATIVE  NEGATIVE mg/dL   Protein, ur NEGATIVE  NEGATIVE mg/dL   Urobilinogen, UA 0.2  0.0 - 1.0 mg/dL   Nitrite NEGATIVE  NEGATIVE   Leukocytes, UA NEGATIVE  NEGATIVE   Imaging Studies:  No results found.  Assessment: Barbara Heath is  28 y.o. G3P1011 at [redacted]w[redacted]d presents with preterm contractions. Pt is >34 wks, does not meet criteria for tocolysis or steroids. Pt declines pain meds and requests to be discharged. Recommend increased hydration and cautioned orgasm will worsen her contractions. Her next appt on Tuesday next week. Fetus is reassuring. Will discharge home at this time. Pt given PTL precautions. No evidence of Infection at this time.  Tawana Scale 1/2/20156:43 PM

## 2013-12-10 ENCOUNTER — Encounter: Payer: Self-pay | Admitting: Advanced Practice Midwife

## 2013-12-10 ENCOUNTER — Telehealth: Payer: Self-pay | Admitting: *Deleted

## 2013-12-10 ENCOUNTER — Ambulatory Visit (INDEPENDENT_AMBULATORY_CARE_PROVIDER_SITE_OTHER): Payer: Medicaid Other | Admitting: Advanced Practice Midwife

## 2013-12-10 VITALS — BP 122/76 | Temp 98.4°F | Wt 198.0 lb

## 2013-12-10 DIAGNOSIS — Z3480 Encounter for supervision of other normal pregnancy, unspecified trimester: Secondary | ICD-10-CM

## 2013-12-10 DIAGNOSIS — D573 Sickle-cell trait: Secondary | ICD-10-CM

## 2013-12-10 DIAGNOSIS — Z3483 Encounter for supervision of other normal pregnancy, third trimester: Secondary | ICD-10-CM

## 2013-12-10 LAB — POCT URINALYSIS DIP (DEVICE)
BILIRUBIN URINE: NEGATIVE
Glucose, UA: NEGATIVE mg/dL
Hgb urine dipstick: NEGATIVE
Ketones, ur: NEGATIVE mg/dL
LEUKOCYTES UA: NEGATIVE
NITRITE: NEGATIVE
Protein, ur: NEGATIVE mg/dL
Specific Gravity, Urine: 1.02 (ref 1.005–1.030)
Urobilinogen, UA: 1 mg/dL (ref 0.0–1.0)
pH: 7 (ref 5.0–8.0)

## 2013-12-10 LAB — OB RESULTS CONSOLE GC/CHLAMYDIA
Chlamydia: NEGATIVE
Gonorrhea: NEGATIVE

## 2013-12-10 LAB — OB RESULTS CONSOLE GBS: GBS: POSITIVE

## 2013-12-10 NOTE — Patient Instructions (Signed)
Third Trimester of Pregnancy  The third trimester is from week 29 through week 42, months 7 through 9. The third trimester is a time when the fetus is growing rapidly. At the end of the ninth month, the fetus is about 20 inches in length and weighs 6 10 pounds.   BODY CHANGES  Your body goes through many changes during pregnancy. The changes vary from woman to woman.    Your weight will continue to increase. You can expect to gain 25 35 pounds (11 16 kg) by the end of the pregnancy.   You may begin to get stretch marks on your hips, abdomen, and breasts.   You may urinate more often because the fetus is moving lower into your pelvis and pressing on your bladder.   You may develop or continue to have heartburn as a result of your pregnancy.   You may develop constipation because certain hormones are causing the muscles that push waste through your intestines to slow down.   You may develop hemorrhoids or swollen, bulging veins (varicose veins).   You may have pelvic pain because of the weight gain and pregnancy hormones relaxing your joints between the bones in your pelvis. Back aches may result from over exertion of the muscles supporting your posture.   Your breasts will continue to grow and be tender. A yellow discharge may leak from your breasts called colostrum.   Your belly button may stick out.   You may feel short of breath because of your expanding uterus.   You may notice the fetus "dropping," or moving lower in your abdomen.   You may have a bloody mucus discharge. This usually occurs a few days to a week before labor begins.   Your cervix becomes thin and soft (effaced) near your due date.  WHAT TO EXPECT AT YOUR PRENATAL EXAMS   You will have prenatal exams every 2 weeks until week 36. Then, you will have weekly prenatal exams. During a routine prenatal visit:   You will be weighed to make sure you and the fetus are growing normally.   Your blood pressure is taken.   Your abdomen will be  measured to track your baby's growth.   The fetal heartbeat will be listened to.   Any test results from the previous visit will be discussed.   You may have a cervical check near your due date to see if you have effaced.  At around 36 weeks, your caregiver will check your cervix. At the same time, your caregiver will also perform a test on the secretions of the vaginal tissue. This test is to determine if a type of bacteria, Group B streptococcus, is present. Your caregiver will explain this further.  Your caregiver may ask you:   What your birth plan is.   How you are feeling.   If you are feeling the baby move.   If you have had any abnormal symptoms, such as leaking fluid, bleeding, severe headaches, or abdominal cramping.   If you have any questions.  Other tests or screenings that may be performed during your third trimester include:   Blood tests that check for low iron levels (anemia).   Fetal testing to check the health, activity level, and growth of the fetus. Testing is done if you have certain medical conditions or if there are problems during the pregnancy.  FALSE LABOR  You may feel small, irregular contractions that eventually go away. These are called Braxton Hicks contractions, or   false labor. Contractions may last for hours, days, or even weeks before true labor sets in. If contractions come at regular intervals, intensify, or become painful, it is best to be seen by your caregiver.   SIGNS OF LABOR    Menstrual-like cramps.   Contractions that are 5 minutes apart or less.   Contractions that start on the top of the uterus and spread down to the lower abdomen and back.   A sense of increased pelvic pressure or back pain.   A watery or bloody mucus discharge that comes from the vagina.  If you have any of these signs before the 37th week of pregnancy, call your caregiver right away. You need to go to the hospital to get checked immediately.  HOME CARE INSTRUCTIONS    Avoid all  smoking, herbs, alcohol, and unprescribed drugs. These chemicals affect the formation and growth of the baby.   Follow your caregiver's instructions regarding medicine use. There are medicines that are either safe or unsafe to take during pregnancy.   Exercise only as directed by your caregiver. Experiencing uterine cramps is a good sign to stop exercising.   Continue to eat regular, healthy meals.   Wear a good support bra for breast tenderness.   Do not use hot tubs, steam rooms, or saunas.   Wear your seat belt at all times when driving.   Avoid raw meat, uncooked cheese, cat litter boxes, and soil used by cats. These carry germs that can cause birth defects in the baby.   Take your prenatal vitamins.   Try taking a stool softener (if your caregiver approves) if you develop constipation. Eat more high-fiber foods, such as fresh vegetables or fruit and whole grains. Drink plenty of fluids to keep your urine clear or pale yellow.   Take warm sitz baths to soothe any pain or discomfort caused by hemorrhoids. Use hemorrhoid cream if your caregiver approves.   If you develop varicose veins, wear support hose. Elevate your feet for 15 minutes, 3 4 times a day. Limit salt in your diet.   Avoid heavy lifting, wear low heal shoes, and practice good posture.   Rest a lot with your legs elevated if you have leg cramps or low back pain.   Visit your dentist if you have not gone during your pregnancy. Use a soft toothbrush to brush your teeth and be gentle when you floss.   A sexual relationship may be continued unless your caregiver directs you otherwise.   Do not travel far distances unless it is absolutely necessary and only with the approval of your caregiver.   Take prenatal classes to understand, practice, and ask questions about the labor and delivery.   Make a trial run to the hospital.   Pack your hospital bag.   Prepare the baby's nursery.   Continue to go to all your prenatal visits as directed  by your caregiver.  SEEK MEDICAL CARE IF:   You are unsure if you are in labor or if your water has broken.   You have dizziness.   You have mild pelvic cramps, pelvic pressure, or nagging pain in your abdominal area.   You have persistent nausea, vomiting, or diarrhea.   You have a bad smelling vaginal discharge.   You have pain with urination.  SEEK IMMEDIATE MEDICAL CARE IF:    You have a fever.   You are leaking fluid from your vagina.   You have spotting or bleeding from your vagina.     You have severe abdominal cramping or pain.   You have rapid weight loss or gain.   You have shortness of breath with chest pain.   You notice sudden or extreme swelling of your face, hands, ankles, feet, or legs.   You have not felt your baby move in over an hour.   You have severe headaches that do not go away with medicine.   You have vision changes.  Document Released: 11/15/2001 Document Revised: 07/24/2013 Document Reviewed: 01/22/2013  [employer] Patient Information 2014 [employer].

## 2013-12-10 NOTE — Progress Notes (Signed)
Wants to know if she can go to a different waterbirth class .[facility]. Reviewed there are no other classes and that this hospital requires this class. Agreeable. Cultures done.  I noted after pt left that her glucola was never done. She was here at 25 weeks and then at 32 wks. Will schedule lab only visit this week.

## 2013-12-10 NOTE — Progress Notes (Signed)
P= 100 Edema in ankles, hands and wrists; states it is hard to grip things. ' States "I'm miserable." C/o of intermittent lower abdominal/pelvic pressure and back pain and intermittent contractions.

## 2013-12-10 NOTE — Addendum Note (Signed)
Addended by: Franchot MimesALFARO, Vernie Vinciguerra on: 12/10/2013 03:15 PM   Modules accepted: Orders

## 2013-12-10 NOTE — Telephone Encounter (Addendum)
Message copied by Jill SideAY, Rondel Episcopo L on Tue Dec 10, 2013  4:19 PM ------      Message from: Aviva SignsWILLIAMS, MARIE L      Created: Tue Dec 10, 2013  2:46 PM      Regarding: Missed Glucola, now 35 wks       Saw her today. Reviewed her chart after she left            She never had a glucola.            Was seen at 25 wks and then at 32 wks then today at 35 wks            No glucola            Needs to come tomorrow or this week for lab visit to do this  ------ Called pt and informed her of need for 1hr GTT test to be performed this week. Details of test provided.  Pt voiced understanding and agreed to come in on 1/8 @ 0800 for the test.

## 2013-12-11 LAB — GC/CHLAMYDIA PROBE AMP
CT PROBE, AMP APTIMA: NEGATIVE
GC PROBE AMP APTIMA: NEGATIVE

## 2013-12-12 ENCOUNTER — Other Ambulatory Visit: Payer: Medicaid Other

## 2013-12-13 ENCOUNTER — Other Ambulatory Visit: Payer: Medicaid Other

## 2013-12-13 LAB — CULTURE, BETA STREP (GROUP B ONLY)

## 2013-12-16 ENCOUNTER — Encounter: Payer: Self-pay | Admitting: Advanced Practice Midwife

## 2013-12-25 ENCOUNTER — Ambulatory Visit (INDEPENDENT_AMBULATORY_CARE_PROVIDER_SITE_OTHER): Payer: Medicaid Other | Admitting: Obstetrics and Gynecology

## 2013-12-25 ENCOUNTER — Encounter: Payer: Self-pay | Admitting: Obstetrics and Gynecology

## 2013-12-25 VITALS — BP 116/78 | Temp 97.2°F | Wt 202.1 lb

## 2013-12-25 DIAGNOSIS — O99820 Streptococcus B carrier state complicating pregnancy: Secondary | ICD-10-CM

## 2013-12-25 DIAGNOSIS — Z22330 Carrier of Group B streptococcus: Secondary | ICD-10-CM

## 2013-12-25 DIAGNOSIS — O99891 Other specified diseases and conditions complicating pregnancy: Secondary | ICD-10-CM

## 2013-12-25 DIAGNOSIS — Z3480 Encounter for supervision of other normal pregnancy, unspecified trimester: Secondary | ICD-10-CM

## 2013-12-25 DIAGNOSIS — O9989 Other specified diseases and conditions complicating pregnancy, childbirth and the puerperium: Secondary | ICD-10-CM

## 2013-12-25 LAB — POCT URINALYSIS DIP (DEVICE)
Bilirubin Urine: NEGATIVE
Glucose, UA: NEGATIVE mg/dL
Hgb urine dipstick: NEGATIVE
Ketones, ur: NEGATIVE mg/dL
LEUKOCYTES UA: NEGATIVE
NITRITE: NEGATIVE
PROTEIN: NEGATIVE mg/dL
Specific Gravity, Urine: >=1.03 (ref 1.005–1.030)
UROBILINOGEN UA: 1 mg/dL (ref 0.0–1.0)
pH: 6 (ref 5.0–8.0)

## 2013-12-25 NOTE — Progress Notes (Signed)
P=104  Pt reports increase in contractions/cramping, leaking fluid for past 2 days.

## 2013-12-25 NOTE — Progress Notes (Signed)
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 [facility] to [facility] [place] [facility]> discouraged due to advanced GA, but advised to keep hydrated and find out place to go for care if needed if she decides to go. Plans Mirena. Explained GBS carriage.

## 2013-12-25 NOTE — Patient Instructions (Signed)
Third Trimester of Pregnancy  The third trimester is from week 29 through week 42, months 7 through 9. The third trimester is a time when the fetus is growing rapidly. At the end of the ninth month, the fetus is about 20 inches in length and weighs 6 10 pounds.   BODY CHANGES  Your body goes through many changes during pregnancy. The changes vary from woman to woman.    Your weight will continue to increase. You can expect to gain 25 35 pounds (11 16 kg) by the end of the pregnancy.   You may begin to get stretch marks on your hips, abdomen, and breasts.   You may urinate more often because the fetus is moving lower into your pelvis and pressing on your bladder.   You may develop or continue to have heartburn as a result of your pregnancy.   You may develop constipation because certain hormones are causing the muscles that push waste through your intestines to slow down.   You may develop hemorrhoids or swollen, bulging veins (varicose veins).   You may have pelvic pain because of the weight gain and pregnancy hormones relaxing your joints between the bones in your pelvis. Back aches may result from over exertion of the muscles supporting your posture.   Your breasts will continue to grow and be tender. A yellow discharge may leak from your breasts called colostrum.   Your belly button may stick out.   You may feel short of breath because of your expanding uterus.   You may notice the fetus "dropping," or moving lower in your abdomen.   You may have a bloody mucus discharge. This usually occurs a few days to a week before labor begins.   Your cervix becomes thin and soft (effaced) near your due date.  WHAT TO EXPECT AT YOUR PRENATAL EXAMS   You will have prenatal exams every 2 weeks until week 36. Then, you will have weekly prenatal exams. During a routine prenatal visit:   You will be weighed to make sure you and the fetus are growing normally.   Your blood pressure is taken.   Your abdomen will be  measured to track your baby's growth.   The fetal heartbeat will be listened to.   Any test results from the previous visit will be discussed.   You may have a cervical check near your due date to see if you have effaced.  At around 36 weeks, your caregiver will check your cervix. At the same time, your caregiver will also perform a test on the secretions of the vaginal tissue. This test is to determine if a type of bacteria, Group B streptococcus, is present. Your caregiver will explain this further.  Your caregiver may ask you:   What your birth plan is.   How you are feeling.   If you are feeling the baby move.   If you have had any abnormal symptoms, such as leaking fluid, bleeding, severe headaches, or abdominal cramping.   If you have any questions.  Other tests or screenings that may be performed during your third trimester include:   Blood tests that check for low iron levels (anemia).   Fetal testing to check the health, activity level, and growth of the fetus. Testing is done if you have certain medical conditions or if there are problems during the pregnancy.  FALSE LABOR  You may feel small, irregular contractions that eventually go away. These are called Braxton Hicks contractions, or   false labor. Contractions may last for hours, days, or even weeks before true labor sets in. If contractions come at regular intervals, intensify, or become painful, it is best to be seen by your caregiver.   SIGNS OF LABOR    Menstrual-like cramps.   Contractions that are 5 minutes apart or less.   Contractions that start on the top of the uterus and spread down to the lower abdomen and back.   A sense of increased pelvic pressure or back pain.   A watery or bloody mucus discharge that comes from the vagina.  If you have any of these signs before the 37th week of pregnancy, call your caregiver right away. You need to go to the hospital to get checked immediately.  HOME CARE INSTRUCTIONS    Avoid all  smoking, herbs, alcohol, and unprescribed drugs. These chemicals affect the formation and growth of the baby.   Follow your caregiver's instructions regarding medicine use. There are medicines that are either safe or unsafe to take during pregnancy.   Exercise only as directed by your caregiver. Experiencing uterine cramps is a good sign to stop exercising.   Continue to eat regular, healthy meals.   Wear a good support bra for breast tenderness.   Do not use hot tubs, steam rooms, or saunas.   Wear your seat belt at all times when driving.   Avoid raw meat, uncooked cheese, cat litter boxes, and soil used by cats. These carry germs that can cause birth defects in the baby.   Take your prenatal vitamins.   Try taking a stool softener (if your caregiver approves) if you develop constipation. Eat more high-fiber foods, such as fresh vegetables or fruit and whole grains. Drink plenty of fluids to keep your urine clear or pale yellow.   Take warm sitz baths to soothe any pain or discomfort caused by hemorrhoids. Use hemorrhoid cream if your caregiver approves.   If you develop varicose veins, wear support hose. Elevate your feet for 15 minutes, 3 4 times a day. Limit salt in your diet.   Avoid heavy lifting, wear low heal shoes, and practice good posture.   Rest a lot with your legs elevated if you have leg cramps or low back pain.   Visit your dentist if you have not gone during your pregnancy. Use a soft toothbrush to brush your teeth and be gentle when you floss.   A sexual relationship may be continued unless your caregiver directs you otherwise.   Do not travel far distances unless it is absolutely necessary and only with the approval of your caregiver.   Take prenatal classes to understand, practice, and ask questions about the labor and delivery.   Make a trial run to the hospital.   Pack your hospital bag.   Prepare the baby's nursery.   Continue to go to all your prenatal visits as directed  by your caregiver.  SEEK MEDICAL CARE IF:   You are unsure if you are in labor or if your water has broken.   You have dizziness.   You have mild pelvic cramps, pelvic pressure, or nagging pain in your abdominal area.   You have persistent nausea, vomiting, or diarrhea.   You have a bad smelling vaginal discharge.   You have pain with urination.  SEEK IMMEDIATE MEDICAL CARE IF:    You have a fever.   You are leaking fluid from your vagina.   You have spotting or bleeding from your vagina.     You have severe abdominal cramping or pain.   You have rapid weight loss or gain.   You have shortness of breath with chest pain.   You notice sudden or extreme swelling of your face, hands, ankles, feet, or legs.   You have not felt your baby move in over an hour.   You have severe headaches that do not go away with medicine.   You have vision changes.  Document Released: 11/15/2001 Document Revised: 07/24/2013 Document Reviewed: 01/22/2013  [employer] Patient Information 2014 [employer].

## 2014-01-01 ENCOUNTER — Ambulatory Visit (INDEPENDENT_AMBULATORY_CARE_PROVIDER_SITE_OTHER): Payer: Medicaid Other | Admitting: Advanced Practice Midwife

## 2014-01-01 VITALS — BP 123/91 | Temp 97.7°F | Wt 203.2 lb

## 2014-01-01 DIAGNOSIS — Z91199 Patient's noncompliance with other medical treatment and regimen due to unspecified reason: Secondary | ICD-10-CM

## 2014-01-01 DIAGNOSIS — O9989 Other specified diseases and conditions complicating pregnancy, childbirth and the puerperium: Secondary | ICD-10-CM

## 2014-01-01 DIAGNOSIS — Z3480 Encounter for supervision of other normal pregnancy, unspecified trimester: Secondary | ICD-10-CM

## 2014-01-01 DIAGNOSIS — O99891 Other specified diseases and conditions complicating pregnancy: Secondary | ICD-10-CM

## 2014-01-01 DIAGNOSIS — Z9119 Patient's noncompliance with other medical treatment and regimen: Secondary | ICD-10-CM

## 2014-01-01 LAB — POCT URINALYSIS DIP (DEVICE)
Bilirubin Urine: NEGATIVE
GLUCOSE, UA: NEGATIVE mg/dL
Hgb urine dipstick: NEGATIVE
Ketones, ur: NEGATIVE mg/dL
LEUKOCYTES UA: NEGATIVE
NITRITE: NEGATIVE
Protein, ur: NEGATIVE mg/dL
Specific Gravity, Urine: 1.025 (ref 1.005–1.030)
UROBILINOGEN UA: 2 mg/dL — AB (ref 0.0–1.0)
pH: 6.5 (ref 5.0–8.0)

## 2014-01-01 LAB — GLUCOSE, CAPILLARY: Glucose-Capillary: 81 mg/dL (ref 70–99)

## 2014-01-01 NOTE — Patient Instructions (Signed)

## 2014-01-01 NOTE — Progress Notes (Signed)
Pulse- 98 Patient reports a lot of pressure and pain in pelvis and vagina as well as irregular contractions

## 2014-01-02 NOTE — Progress Notes (Signed)
Never got glucola done. Missed several appointments.  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  States we always call her at the last minute to do an appointment. I told her we usually schedule the appointments at the end of each one.  Discussed risks to mom and baby if has undiagnosed diabetes.  Has irregular contractions. No complaints  Labor precautions reviewed. States went to waterbirth class.  I told her we needed copy of that certificate and waterbirth consent. States will do today.

## 2014-01-10 ENCOUNTER — Encounter: Payer: Self-pay | Admitting: Obstetrics & Gynecology

## 2014-01-10 ENCOUNTER — Ambulatory Visit (INDEPENDENT_AMBULATORY_CARE_PROVIDER_SITE_OTHER): Payer: Medicaid Other | Admitting: Obstetrics & Gynecology

## 2014-01-10 VITALS — BP 134/90 | Temp 97.1°F | Wt 208.8 lb

## 2014-01-10 DIAGNOSIS — O99891 Other specified diseases and conditions complicating pregnancy: Secondary | ICD-10-CM

## 2014-01-10 DIAGNOSIS — Z3480 Encounter for supervision of other normal pregnancy, unspecified trimester: Secondary | ICD-10-CM

## 2014-01-10 DIAGNOSIS — Z22330 Carrier of Group B streptococcus: Secondary | ICD-10-CM

## 2014-01-10 DIAGNOSIS — O99820 Streptococcus B carrier state complicating pregnancy: Secondary | ICD-10-CM

## 2014-01-10 DIAGNOSIS — O9989 Other specified diseases and conditions complicating pregnancy, childbirth and the puerperium: Secondary | ICD-10-CM

## 2014-01-10 LAB — POCT URINALYSIS DIP (DEVICE)
Bilirubin Urine: NEGATIVE
GLUCOSE, UA: NEGATIVE mg/dL
HGB URINE DIPSTICK: NEGATIVE
Ketones, ur: NEGATIVE mg/dL
Leukocytes, UA: NEGATIVE
NITRITE: NEGATIVE
Protein, ur: NEGATIVE mg/dL
Specific Gravity, Urine: 1.02 (ref 1.005–1.030)
UROBILINOGEN UA: 0.2 mg/dL (ref 0.0–1.0)
pH: 7 (ref 5.0–8.0)

## 2014-01-10 NOTE — Progress Notes (Signed)
P= 100 C/o of intermittent lower abdominal/pelvic pain and pressure. Irregular contractions.

## 2014-01-10 NOTE — Patient Instructions (Signed)
Cesarean Delivery °Care After °Refer to this sheet in the next few weeks. These instructions provide you with information on caring for yourself after your procedure. Your health care provider may also give you specific instructions. Your treatment has been planned according to current medical practices, but problems sometimes occur. Call your health care provider if you have any problems or questions after you go home. °HOME CARE INSTRUCTIONS  °· Only take over-the-counter or prescription medications as directed by your health care provider. °· Do not drink alcohol, especially if you are breastfeeding or taking medication to relieve pain. °· Do not chew or smoke tobacco. °· Continue to use good perineal care. Good perineal care includes: °· Wiping your perineum from front to back. °· Keeping your perineum clean. °· Check your surgical cut (incision) daily for increased redness, drainage, swelling, or separation of skin. °· Clean your incision gently with soap and water every day, and then pat it dry. If your health care provider says it is OK, leave the incision uncovered. Use a bandage (dressing) if the incision is draining fluid or appears irritated. If the adhesive strips across the incision do not fall off within 7 days, carefully peel them off. °· Hug a pillow when coughing or sneezing until your incision is healed. This helps to relieve pain. °· Do not use tampons or douche until your health care provider says it is okay. °· Shower, wash your hair, and take tub baths as directed by your health care provider. °· Wear a well-fitting bra that provides breast support. °· Limit wearing support panties or control-top hose. °· Drink enough fluids to keep your urine clear or pale yellow. °· Eat high-fiber foods such as whole grain cereals and breads, brown rice, beans, and fresh fruits and vegetables every day. These foods may help prevent or relieve constipation. °· Resume activities such as climbing stairs,  driving, lifting, exercising, or traveling as directed by your health care provider. °· Talk to your health care provider about resuming sexual activities. This is dependent upon your risk of infection, your rate of healing, and your comfort and desire to resume sexual activity. °· Try to have someone help you with your household activities and your newborn for at least a few days after you leave the hospital. °· Rest as much as possible. Try to rest or take a nap when your newborn is sleeping. °· Increase your activities gradually. °· Keep all of your scheduled postpartum appointments. It is very important to keep your scheduled follow-up appointments. At these appointments, your health care provider will be checking to make sure that you are healing physically and emotionally. °SEEK MEDICAL CARE IF:  °· You are passing large clots from your vagina. Save any clots to show your health care provider. °· You have a foul smelling discharge from your vagina. °· You have trouble urinating. °· You are urinating frequently. °· You have pain when you urinate. °· You have a change in your bowel movements. °· You have increasing redness, pain, or swelling near your incision. °· You have pus draining from your incision. °· Your incision is separating. °· You have painful, hard, or reddened breasts. °· You have a severe headache. °· You have blurred vision or see spots. °· You feel sad or depressed. °· You have thoughts of hurting yourself or your newborn. °· You have questions about your care, the care of your newborn, or medications. °· You are dizzy or lightheaded. °· You have a rash. °· You   have pain, redness, or swelling at the site of the removed intravenous access (IV) tube. °· You have nausea or vomiting. °· You stopped breastfeeding and have not had a menstrual period within 12 weeks of stopping. °· You are not breastfeeding and have not had a menstrual period within 12 weeks of delivery. °· You have a fever. °SEEK  IMMEDIATE MEDICAL CARE IF: °· You have persistent pain. °· You have chest pain. °· You have shortness of breath. °· You faint. °· You have leg pain. °· You have stomach pain. °· Your vaginal bleeding saturates 2 or more sanitary pads in 1 hour. °MAKE SURE YOU:  °· Understand these instructions. °· Will watch your condition. °· Will get help right away if you are not doing well or get worse. °Document Released: 08/13/2002 Document Revised: 07/24/2013 Document Reviewed: 07/18/2012 °ExitCare® Patient Information ©2014 [employer]. ° ° ° °

## 2014-01-10 NOTE — Progress Notes (Signed)
Reviewed labor precautions.  No problems.

## 2014-01-11 ENCOUNTER — Inpatient Hospital Stay (HOSPITAL_COMMUNITY)
Admission: AD | Admit: 2014-01-11 | Discharge: 2014-01-12 | DRG: 775 | Disposition: A | Discharge status: Home / Self Care | Payer: Medicaid Other | Source: Ambulatory Visit | Attending: Obstetrics & Gynecology | Admitting: Obstetrics & Gynecology

## 2014-01-11 ENCOUNTER — Encounter (HOSPITAL_COMMUNITY): Payer: Medicaid Other | Admitting: Anesthesiology

## 2014-01-11 ENCOUNTER — Encounter (HOSPITAL_COMMUNITY): Payer: Self-pay

## 2014-01-11 ENCOUNTER — Inpatient Hospital Stay (HOSPITAL_COMMUNITY): Payer: Medicaid Other | Admitting: Anesthesiology

## 2014-01-11 DIAGNOSIS — Z22330 Carrier of Group B streptococcus: Secondary | ICD-10-CM

## 2014-01-11 DIAGNOSIS — Z3480 Encounter for supervision of other normal pregnancy, unspecified trimester: Secondary | ICD-10-CM

## 2014-01-11 DIAGNOSIS — O9989 Other specified diseases and conditions complicating pregnancy, childbirth and the puerperium: Principal | ICD-10-CM

## 2014-01-11 DIAGNOSIS — D573 Sickle-cell trait: Secondary | ICD-10-CM

## 2014-01-11 DIAGNOSIS — O9902 Anemia complicating childbirth: Secondary | ICD-10-CM

## 2014-01-11 DIAGNOSIS — O99892 Other specified diseases and conditions complicating childbirth: Secondary | ICD-10-CM

## 2014-01-11 DIAGNOSIS — O4290 Premature rupture of membranes, unspecified as to length of time between rupture and onset of labor, unspecified weeks of gestation: Secondary | ICD-10-CM | POA: Diagnosis present

## 2014-01-11 HISTORY — DX: Anemia, unspecified: D64.9

## 2014-01-11 LAB — CBC
HEMATOCRIT: 36.5 % (ref 36.0–46.0)
Hemoglobin: 12.5 g/dL (ref 12.0–15.0)
MCH: 27.7 pg (ref 26.0–34.0)
MCHC: 34.2 g/dL (ref 30.0–36.0)
MCV: 80.9 fL (ref 78.0–100.0)
PLATELETS: 196 10*3/uL (ref 150–400)
RBC: 4.51 MIL/uL (ref 3.87–5.11)
RDW: 14.1 % (ref 11.5–15.5)
WBC: 8.2 10*3/uL (ref 4.0–10.5)

## 2014-01-11 LAB — TYPE AND SCREEN
ABO/RH(D): O POS
Antibody Screen: NEGATIVE

## 2014-01-11 LAB — RPR: RPR Ser Ql: NONREACTIVE

## 2014-01-11 MED ORDER — PHENYLEPHRINE 40 MCG/ML (10ML) SYRINGE FOR IV PUSH (FOR BLOOD PRESSURE SUPPORT)
80.0000 ug | PREFILLED_SYRINGE | INTRAVENOUS | Status: DC | PRN
  Filled 2014-01-11: qty 10
  Filled 2014-01-11: qty 2

## 2014-01-11 MED ORDER — IBUPROFEN 600 MG PO TABS
600.0000 mg | ORAL_TABLET | Freq: Four times a day (QID) | ORAL | Status: DC
  Administered 2014-01-11 – 2014-01-12 (×4): 600 mg via ORAL
  Filled 2014-01-11 (×4): qty 1

## 2014-01-11 MED ORDER — OXYCODONE-ACETAMINOPHEN 5-325 MG PO TABS: 1.0000 | ORAL_TABLET | ORAL | Status: DC | PRN

## 2014-01-11 MED ORDER — ONDANSETRON HCL 4 MG/2ML IJ SOLN: 4.0000 mg | INTRAMUSCULAR | Status: DC | PRN

## 2014-01-11 MED ORDER — SENNOSIDES-DOCUSATE SODIUM 8.6-50 MG PO TABS
2.0000 | ORAL_TABLET | ORAL | Status: DC
  Administered 2014-01-11: 2 via ORAL
  Filled 2014-01-11: qty 2

## 2014-01-11 MED ORDER — FENTANYL 2.5 MCG/ML BUPIVACAINE 1/10 % EPIDURAL INFUSION (WH - ANES)
INTRAMUSCULAR | Status: DC | PRN
  Administered 2014-01-11: 14 mL/h via EPIDURAL

## 2014-01-11 MED ORDER — ONDANSETRON HCL 4 MG/2ML IJ SOLN: 4.0000 mg | Freq: Four times a day (QID) | INTRAMUSCULAR | Status: DC | PRN

## 2014-01-11 MED ORDER — CITRIC ACID-SODIUM CITRATE 334-500 MG/5ML PO SOLN
30.0000 mL | ORAL | Status: DC | PRN
Start: 2014-01-11 — End: 2014-01-11

## 2014-01-11 MED ORDER — PRENATAL MULTIVITAMIN CH
1.0000 | ORAL_TABLET | Freq: Every day | ORAL | Status: DC
  Filled 2014-01-11: qty 1

## 2014-01-11 MED ORDER — IBUPROFEN 600 MG PO TABS
600.0000 mg | ORAL_TABLET | Freq: Four times a day (QID) | ORAL | Status: DC | PRN
Start: 2014-01-11 — End: 2014-01-11
  Administered 2014-01-11: 600 mg via ORAL
  Filled 2014-01-11: qty 1

## 2014-01-11 MED ORDER — ACETAMINOPHEN 325 MG PO TABS: 650.0000 mg | ORAL_TABLET | ORAL | Status: DC | PRN

## 2014-01-11 MED ORDER — ZOLPIDEM TARTRATE 5 MG PO TABS: 5.0000 mg | ORAL_TABLET | Freq: Every evening | ORAL | Status: DC | PRN

## 2014-01-11 MED ORDER — EPHEDRINE 5 MG/ML INJ
10.0000 mg | INTRAVENOUS | Status: DC | PRN
  Filled 2014-01-11: qty 2

## 2014-01-11 MED ORDER — DIBUCAINE 1 % RE OINT: 1.0000 "application " | TOPICAL_OINTMENT | RECTAL | Status: DC | PRN

## 2014-01-11 MED ORDER — PENICILLIN G POTASSIUM 5000000 UNITS IJ SOLR
5.0000 10*6.[IU] | Freq: Once | INTRAVENOUS | Status: DC
  Filled 2014-01-11: qty 5

## 2014-01-11 MED ORDER — DIPHENHYDRAMINE HCL 50 MG/ML IJ SOLN: 12.5000 mg | INTRAMUSCULAR | Status: DC | PRN

## 2014-01-11 MED ORDER — DIPHENHYDRAMINE HCL 25 MG PO CAPS: 25.0000 mg | ORAL_CAPSULE | Freq: Four times a day (QID) | ORAL | Status: DC | PRN

## 2014-01-11 MED ORDER — OXYTOCIN 40 UNITS IN LACTATED RINGERS INFUSION - SIMPLE MED
62.5000 mL/h | INTRAVENOUS | Status: DC
  Filled 2014-01-11: qty 1000

## 2014-01-11 MED ORDER — FLEET ENEMA 7-19 GM/118ML RE ENEM
1.0000 | ENEMA | RECTAL | Status: DC | PRN
Start: 2014-01-11 — End: 2014-01-11

## 2014-01-11 MED ORDER — LIDOCAINE HCL (PF) 1 % IJ SOLN
30.0000 mL | INTRAMUSCULAR | Status: DC | PRN
  Filled 2014-01-11 (×2): qty 30

## 2014-01-11 MED ORDER — LIDOCAINE HCL (PF) 1 % IJ SOLN
INTRAMUSCULAR | Status: DC | PRN
  Administered 2014-01-11 (×2): 9 mL

## 2014-01-11 MED ORDER — FENTANYL CITRATE 0.05 MG/ML IJ SOLN: 100.0000 ug | INTRAMUSCULAR | Status: DC | PRN

## 2014-01-11 MED ORDER — FENTANYL 2.5 MCG/ML BUPIVACAINE 1/10 % EPIDURAL INFUSION (WH - ANES)
14.0000 mL/h | INTRAMUSCULAR | Status: DC | PRN
  Filled 2014-01-11: qty 125

## 2014-01-11 MED ORDER — SIMETHICONE 80 MG PO CHEW: 80.0000 mg | CHEWABLE_TABLET | ORAL | Status: DC | PRN

## 2014-01-11 MED ORDER — LACTATED RINGERS IV SOLN
500.0000 mL | Freq: Once | INTRAVENOUS | Status: AC
  Administered 2014-01-11: 08:00:00 via INTRAVENOUS

## 2014-01-11 MED ORDER — DEXTROSE 5 % IV SOLN
2.5000 10*6.[IU] | INTRAVENOUS | Status: DC
  Filled 2014-01-11 (×2): qty 2.5

## 2014-01-11 MED ORDER — LANOLIN HYDROUS EX OINT: TOPICAL_OINTMENT | CUTANEOUS | Status: DC | PRN

## 2014-01-11 MED ORDER — AMPICILLIN SODIUM 2 G IJ SOLR
2.0000 g | Freq: Four times a day (QID) | INTRAMUSCULAR | Status: DC
  Administered 2014-01-11: 2 g via INTRAVENOUS
  Filled 2014-01-11 (×2): qty 2000

## 2014-01-11 MED ORDER — LACTATED RINGERS IV SOLN: INTRAVENOUS | Status: DC

## 2014-01-11 MED ORDER — EPHEDRINE 5 MG/ML INJ
10.0000 mg | INTRAVENOUS | Status: DC | PRN
  Filled 2014-01-11: qty 2
  Filled 2014-01-11: qty 4

## 2014-01-11 MED ORDER — TETANUS-DIPHTH-ACELL PERTUSSIS 5-2.5-18.5 LF-MCG/0.5 IM SUSP
0.5000 mL | Freq: Once | INTRAMUSCULAR | Status: AC
  Administered 2014-01-12: 0.5 mL via INTRAMUSCULAR
  Filled 2014-01-11: qty 0.5

## 2014-01-11 MED ORDER — OXYTOCIN BOLUS FROM INFUSION
500.0000 mL | INTRAVENOUS | Status: DC
  Administered 2014-01-11: 500 mL via INTRAVENOUS

## 2014-01-11 MED ORDER — PHENYLEPHRINE 40 MCG/ML (10ML) SYRINGE FOR IV PUSH (FOR BLOOD PRESSURE SUPPORT)
80.0000 ug | PREFILLED_SYRINGE | INTRAVENOUS | Status: DC | PRN
  Filled 2014-01-11: qty 2

## 2014-01-11 MED ORDER — WITCH HAZEL-GLYCERIN EX PADS: 1.0000 "application " | MEDICATED_PAD | CUTANEOUS | Status: DC | PRN

## 2014-01-11 MED ORDER — LACTATED RINGERS IV SOLN: 500.0000 mL | INTRAVENOUS | Status: DC | PRN

## 2014-01-11 MED ORDER — ONDANSETRON HCL 4 MG PO TABS: 4.0000 mg | ORAL_TABLET | ORAL | Status: DC | PRN

## 2014-01-11 MED ORDER — BENZOCAINE-MENTHOL 20-0.5 % EX AERO: 1.0000 "application " | INHALATION_SPRAY | CUTANEOUS | Status: DC | PRN

## 2014-01-11 NOTE — MAU Note (Signed)
Contractions since 3 am.  Felt gush at 5 or 6 am.

## 2014-01-11 NOTE — Anesthesia Postprocedure Evaluation (Signed)
Anesthesia Post Note  Patient: Barbara Heath  Procedure(s) Performed: * No procedures listed *  Anesthesia type: Epidural  Patient location: Mother/Baby  Post pain: Pain level controlled  Post assessment: Post-op Vital signs reviewed  Last Vitals:  Filed Vitals:   01/11/14 1036  BP: 109/58  Pulse: 77  Temp: 36.9 C  Resp:     Post vital signs: Reviewed  Level of consciousness:alert  Complications: No apparent anesthesia complications

## 2014-01-11 NOTE — H&P (Signed)
Barbara Heath is a 28 y.o. female G3P1011 at 39.4wks presenting for leaking clear fluid since 0530. Reports onset of ctx since that time. Denies N/V/D or H/A. Her preg has been followed by the [facility] and has been remarkable for 1) GBS pos 2) did not get glucola- nl random CBG 3) desires waterbirth. History OB History   Grav Para Term Preterm Abortions TAB SAB Ect Mult Living   '3 1 1 '$ 0'1  1   1     '$ Past Medical History  Diagnosis Date  . Sickle cell trait   . Anemia    Past Surgical History  Procedure Laterality Date  . Wisdom tooth extraction    . No past surgeries    . Cyst removal neck     Family History: family history includes COPD in her paternal grandmother; Kidney disease in her mother. Social History:  reports that she has never smoked. She has never used smokeless tobacco. She reports that she does not drink alcohol or use illicit drugs.   Prenatal Transfer Tool  Maternal Diabetes: No- did not do glucola, but had a normal random CBG at 38wks Genetic Screening: Normal Maternal Ultrasounds/Referrals: Normal Fetal Ultrasounds or other Referrals:  None Maternal Substance Abuse:  No Significant Maternal Medications:  None Significant Maternal Lab Results:  Lab values include: Group B Strep positive Other Comments:  None  ROS  Dilation: 2.5 Effacement (%): 80 Station: -2 Exam by:: [employer] RN Blood pressure 128/74, pulse 86, temperature 98 F (36.7 C), temperature source Oral, resp. rate 18, height $RemoveBe'5\' 4"'jciFLGico$  (1.626 m), weight 92.534 kg (204 lb), last menstrual period 04/09/2013. Exam Physical Exam  Constitutional: She is oriented to person, place, and time. She appears well-developed.  HENT:  Head: Normocephalic.  Neck: Normal range of motion.  Cardiovascular: Normal rate.   Respiratory: Effort normal.  GI:  FHR 120-130, +accels, no decels, occ mi variables Ctx q 3 mins, mod  Musculoskeletal: Normal range of motion.  Neurological: She is alert and oriented  to person, place, and time.  Skin: Skin is warm and dry.  Psychiatric: She has a normal mood and affect. Her behavior is normal. Thought content normal.    Prenatal labs: ABO, Rh: O/POS/-- (08/07 1200) Antibody: NEG (08/07 1200) Rubella: 2.68 (08/07 1200) RPR: NON REAC (08/07 1200)  HBsAg: NEGATIVE (08/07 1200)  HIV: NON REACTIVE (08/07 1200)  GBS: Positive (01/06 0000)   Assessment/Plan: IUP at term SROM/early labor GBS pos Desirous of waterbirth  Admit to [employer] Expectant management Anticipate SVD Rev'd unable to provide waterbirth for pt as she does not have the prepurchased waterbirth kit with her. I apologized for any misunderstanding surrounding obtaining waterbirth supplies.  Serita Grammes 01/11/2014, 8:50 AM

## 2014-01-11 NOTE — Anesthesia Procedure Notes (Signed)
Epidural Patient location during procedure: OB Start time: 01/11/2014 8:37 AM End time: 01/11/2014 8:42 AM  Staffing Anesthesiologist: Leilani AbleHATCHETT, Petra Dumler Performed by: anesthesiologist   Preanesthetic Checklist Completed: patient identified, surgical consent, pre-op evaluation, timeout performed, IV checked, risks and benefits discussed and monitors and equipment checked  Epidural Patient position: sitting Prep: site prepped and draped and DuraPrep Patient monitoring: continuous pulse ox and blood pressure Approach: midline Injection technique: LOR air  Needle:  Needle type: Tuohy  Needle gauge: 17 G Needle length: 9 cm and 9 Needle insertion depth: 7 cm Catheter type: closed end flexible Catheter size: 19 Gauge Catheter at skin depth: 12 cm Test dose: negative and Other  Assessment Sensory level: T9 Events: blood not aspirated, injection not painful, no injection resistance, negative IV test and no paresthesia  Additional Notes Reason for block:procedure for pain

## 2014-01-11 NOTE — Anesthesia Preprocedure Evaluation (Signed)
Anesthesia Evaluation  Patient identified by MRN, date of birth, ID band Patient awake    Reviewed: Allergy & Precautions, H&P , NPO status , Patient's Chart, lab work & pertinent test results  Airway Mallampati: II TM Distance: >3 FB Neck ROM: full    Dental no notable dental hx.    Pulmonary neg pulmonary ROS,    Pulmonary exam normal       Cardiovascular negative cardio ROS      Neuro/Psych negative neurological ROS  negative psych ROS   GI/Hepatic negative GI ROS, Neg liver ROS,   Endo/Other  negative endocrine ROS  Renal/GU negative Renal ROS     Musculoskeletal   Abdominal Normal abdominal exam  (+)   Peds  Hematology   Anesthesia Other Findings   Reproductive/Obstetrics (+) Pregnancy                           Anesthesia Physical Anesthesia Plan  ASA: II  Anesthesia Plan: Epidural   Post-op Pain Management:    Induction:   Airway Management Planned:   Additional Equipment:   Intra-op Plan:   Post-operative Plan:   Informed Consent: I have reviewed the patients History and Physical, chart, labs and discussed the procedure including the risks, benefits and alternatives for the proposed anesthesia with the patient or authorized representative who has indicated his/her understanding and acceptance.     Plan Discussed with:   Anesthesia Plan Comments:         Anesthesia Quick Evaluation  

## 2014-01-12 ENCOUNTER — Ambulatory Visit: Payer: Self-pay

## 2014-01-12 MED ORDER — IBUPROFEN 600 MG PO TABS: 600.0000 mg | ORAL_TABLET | Freq: Four times a day (QID) | ORAL | Status: DC

## 2014-01-12 NOTE — Discharge Summary (Signed)
Obstetric Discharge Summary Reason for Admission: onset of labor and rupture of membranes Prenatal Procedures: ultrasound Intrapartum Procedures: spontaneous vaginal delivery and GBS prophylaxis Postpartum Procedures: none Complications-Operative and Postpartum: none Hemoglobin  Date Value Range Status  01/11/2014 12.5  12.0 - 15.0 g/dL Final     HCT  Date Value Range Status  01/11/2014 36.5  36.0 - 46.0 % Final    Physical Exam:  Filed Vitals:   01/12/14 0535  BP: 102/62  Pulse: 70  Temp: 98.1 F (36.7 C)  Resp: 18    General: alert, cooperative and appears stated age Lochia: appropriate Uterine Fundus: firm Incision: n/a DVT Evaluation: No evidence of DVT seen on physical exam. Negative Homan's sign.  Discharge Diagnoses: Term Pregnancy-delivered  Discharge Information: Date: 01/12/2014 Activity: unrestricted Diet: routine Medications: Ibuprofen Condition: stable Instructions: refer to practice specific booklet Discharge to: home Follow-up Information   [facility]   Contact information:   [address] [place] [id number]      Pt breastfeeding well at discharge and desires Mirena for family planning.  Newborn Data: Live born female  Birth Weight: 7 lb 12.7 oz (3535 g) APGAR: 9, 9  Home with mother.  [facility] CenterMUHAMMAD,Fotini Lemus 01/12/2014, 7:11 AM

## 2014-01-12 NOTE — Discharge Instructions (Signed)

## 2014-01-12 NOTE — Lactation Note (Signed)
This note was copied from the chart of Barbara Teosha Schou. Lactation Consultation Note: Follow up visit with experienced BF mom. She had baby latched to breast when I went into room. Reports that baby has been nursing well with no pain. No questions at present.  Patient Name: Barbara Heath Reason for consult: Follow-up assessment   Maternal Data    Feeding   LATCH Score/Interventions Latch: Grasps breast easily, tongue down, lips flanged, rhythmical sucking.  Audible Swallowing: A few with stimulation  Type of Nipple: Everted at rest and after stimulation  Comfort (Breast/Nipple): Soft / non-tender     Hold (Positioning): No assistance needed to correctly position infant at breast. Intervention(s): Breastfeeding basics reviewed  LATCH Score: 9  Lactation Tools Discussed/Used     Consult Status Consult Status: PRN    Pamelia HoitWeeks, Ashleyanne Hemmingway D Heath, 2:56 PM

## 2014-01-13 ENCOUNTER — Ambulatory Visit: Payer: Self-pay

## 2014-01-13 NOTE — Lactation Note (Signed)
This note was copied from the chart of Barbara Evelin Furches. Lactation Consultation Note Follow up consult:  [name] 2049 hours old and sleeping.  Mother states breastfeeding is going great, denies any problems or soreness.  Reviewed engorgement care and lactation support services.  Encouraged mother to call for further assistance.  Patient Name: Barbara Heath [id number]'s Date: 01/13/2014 Reason for consult: Follow-up assessment   Maternal Data    Feeding    LATCH Score/Interventions                      Lactation Tools Discussed/Used     Consult Status Consult Status: Complete    Hardie PulleyBerkelhammer, Deforrest Bogle Boschen 01/13/2014, 10:41 AM

## 2014-01-13 NOTE — Progress Notes (Signed)
Post discharge chart review completed.  

## 2014-01-13 NOTE — H&P (Signed)
Attestation of Attending Supervision of Advanced Practitioner (CNM/NP): Evaluation and management procedures were performed by the Advanced Practitioner under my supervision and collaboration.  I have reviewed the Advanced Practitioner's note and chart, and I agree with the management and plan.  HARRAWAY-SMITH, Shawntavia Saunders 9:24 AM     

## 2014-01-17 ENCOUNTER — Encounter: Payer: Medicaid Other | Admitting: Family

## 2014-01-17 ENCOUNTER — Encounter: Payer: Self-pay | Admitting: *Deleted

## 2014-02-26 ENCOUNTER — Ambulatory Visit (INDEPENDENT_AMBULATORY_CARE_PROVIDER_SITE_OTHER): Payer: Medicaid Other | Admitting: Obstetrics & Gynecology

## 2014-02-26 ENCOUNTER — Encounter: Payer: Self-pay | Admitting: Obstetrics & Gynecology

## 2014-02-26 VITALS — BP 103/73 | HR 84 | Temp 97.0°F | Resp 20 | Ht 64.0 in | Wt 176.4 lb

## 2014-02-26 DIAGNOSIS — Z30430 Encounter for insertion of intrauterine contraceptive device: Secondary | ICD-10-CM

## 2014-02-26 DIAGNOSIS — Z01812 Encounter for preprocedural laboratory examination: Secondary | ICD-10-CM

## 2014-02-26 DIAGNOSIS — IMO0001 Reserved for inherently not codable concepts without codable children: Secondary | ICD-10-CM

## 2014-02-26 LAB — POCT PREGNANCY, URINE: PREG TEST UR: NEGATIVE

## 2014-02-26 MED ORDER — LEVONORGESTREL 20 MCG/24HR IU IUD
INTRAUTERINE_SYSTEM | Freq: Once | INTRAUTERINE | Status: AC
  Administered 2014-02-26: 1 via INTRAUTERINE

## 2014-02-26 NOTE — Progress Notes (Signed)
Pt desires Mirena IUD insertion - last intercourse was 2 days ago with condom.

## 2014-02-26 NOTE — Progress Notes (Signed)
   Subjective:    Patient ID: Barbara Heath, female    DOB: 11-Sep-1986, 28 y.o.   MRN: [id number]  HPI  Barbara Heath is here for her pp visit and Mirena insertion. She has no problems, reports normal bowel and bladder function, used condoms with sex, denies dyspareunia. She used the Mirena for 5 years in the past and want to use it again. She thinks that she does not want any more kids.  Review of Systems     Objective:   Physical Exam  UPT negative, consent signed, Time out procedure done. Cervix prepped with betadine Mirena was easily placed and the strings were cut to 3-4 cm. Uterus sounded to 9 cm. She tolerated the procedure well.        Assessment & Plan:  PP- Doing well Contraception- Mirena RTC 4 weeks for string check

## 2014-03-26 ENCOUNTER — Encounter: Payer: Self-pay | Admitting: *Deleted

## 2014-10-06 ENCOUNTER — Encounter: Payer: Self-pay | Admitting: Obstetrics & Gynecology

## 2016-01-14 ENCOUNTER — Ambulatory Visit (INDEPENDENT_AMBULATORY_CARE_PROVIDER_SITE_OTHER): Payer: Medicaid Other | Admitting: Obstetrics and Gynecology

## 2016-01-14 ENCOUNTER — Encounter: Payer: Self-pay | Admitting: Obstetrics and Gynecology

## 2016-01-14 VITALS — BP 111/61 | HR 76 | Temp 97.9°F | Ht 66.0 in | Wt 185.5 lb

## 2016-01-14 DIAGNOSIS — Z1151 Encounter for screening for human papillomavirus (HPV): Secondary | ICD-10-CM

## 2016-01-14 DIAGNOSIS — Z01419 Encounter for gynecological examination (general) (routine) without abnormal findings: Secondary | ICD-10-CM

## 2016-01-14 DIAGNOSIS — Z113 Encounter for screening for infections with a predominantly sexual mode of transmission: Secondary | ICD-10-CM

## 2016-01-14 DIAGNOSIS — Z124 Encounter for screening for malignant neoplasm of cervix: Secondary | ICD-10-CM

## 2016-01-14 NOTE — Progress Notes (Signed)
  Subjective:     Barbara Heath is a 30 y.o. female [id number] who is here for a comprehensive physical exam. The patient reports feeling well. She is sexually active using Mirena IUD for contraception. She desires STD screen as she recently had a threesome. Patient denies abnormal vaginal bleeding or discharge  Past Medical History  Diagnosis Date  . Sickle cell trait (HCC)   . Anemia    Past Surgical History  Procedure Laterality Date  . Wisdom tooth extraction    . No past surgeries    . Cyst removal neck     Family History  Problem Relation Age of Onset  . COPD Paternal Grandmother   . Kidney disease Mother     Social History   Social History  . Marital Status: Married    Spouse Name: N/A  . Number of Children: N/A  . Years of Education: N/A   Occupational History  . Not on file.   Social History Main Topics  . Smoking status: Never Smoker   . Smokeless tobacco: Never Used  . Alcohol Use: No     Comment: Wine in the past  . Drug Use: No  . Sexual Activity: Yes    Birth Control/ Protection: Condom     Comment: wants Mirena IUD   Other Topics Concern  . Not on file   Social History Narrative   Health Maintenance  Topic Date Due  . PAP SMEAR  01/02/2005  . INFLUENZA VACCINE  07/06/2015  . TETANUS/TDAP  01/13/2024  . HIV Screening  Completed       Review of Systems Pertinent items are noted in HPI.   Objective:     Blood pressure 111/61, pulse 76, temperature 97.9 F (36.6 C), temperature source Oral, height  (1.676 m), weight 185 lb 8 oz (84.142 kg), not currently breastfeeding. GENERAL: Well-developed, well-nourished female in no acute distress.  HEENT: Normocephalic, atraumatic. Sclerae anicteric.  NECK: Supple. Normal thyroid.  LUNGS: Clear to auscultation bilaterally.  HEART: Regular rate and rhythm. BREASTS: Symmetric in size. No palpable masses or lymphadenopathy, skin changes, or nipple drainage. ABDOMEN: Soft, nontender,  nondistended. No organomegaly. PELVIC: Normal external female genitalia. Vagina is pink and rugated.  Normal discharge. Normal appearing cervix with IUD strings visualized at the os. Uterus is normal in size. No adnexal mass or tenderness. EXTREMITIES: No cyanosis, clubbing, or edema, 2+ distal pulses.    Assessment:    Healthy female exam.      Plan:    pap smear with cultures collected HIV, Hep B and RPR ordered Patient will be contacted with any abnormal results Advised to use condoms with every sexual encounter See After Visit Summary for Counseling Recommendations

## 2016-01-15 LAB — HEPATITIS B SURFACE ANTIGEN: Hepatitis B Surface Ag: NEGATIVE

## 2016-01-15 LAB — CYTOLOGY - PAP

## 2016-01-15 LAB — RPR

## 2016-01-15 LAB — GC/CHLAMYDIA PROBE AMP (~~LOC~~) NOT AT ARMC
CHLAMYDIA, DNA PROBE: NEGATIVE
NEISSERIA GONORRHEA: NEGATIVE

## 2016-01-15 LAB — HIV ANTIBODY (ROUTINE TESTING W REFLEX): HIV: NONREACTIVE

## 2016-02-11 ENCOUNTER — Ambulatory Visit: Payer: Medicaid Other | Admitting: Medical

## 2016-07-14 ENCOUNTER — Emergency Department (HOSPITAL_COMMUNITY)
Admission: EM | Admit: 2016-07-14 | Discharge: 2016-07-15 | Disposition: A | Discharge status: Home / Self Care | Payer: No Typology Code available for payment source | Attending: Emergency Medicine | Admitting: Emergency Medicine

## 2016-07-14 DIAGNOSIS — S62619A Displaced fracture of proximal phalanx of unspecified finger, initial encounter for closed fracture: Secondary | ICD-10-CM

## 2016-07-14 DIAGNOSIS — M25552 Pain in left hip: Secondary | ICD-10-CM | POA: Insufficient documentation

## 2016-07-14 DIAGNOSIS — Z23 Encounter for immunization: Secondary | ICD-10-CM | POA: Insufficient documentation

## 2016-07-14 DIAGNOSIS — S62313A Displaced fracture of base of third metacarpal bone, left hand, initial encounter for closed fracture: Secondary | ICD-10-CM | POA: Diagnosis not present

## 2016-07-14 DIAGNOSIS — R791 Abnormal coagulation profile: Secondary | ICD-10-CM | POA: Diagnosis not present

## 2016-07-14 DIAGNOSIS — Y999 Unspecified external cause status: Secondary | ICD-10-CM | POA: Diagnosis not present

## 2016-07-14 DIAGNOSIS — Y939 Activity, unspecified: Secondary | ICD-10-CM | POA: Insufficient documentation

## 2016-07-14 DIAGNOSIS — Y92410 Unspecified street and highway as the place of occurrence of the external cause: Secondary | ICD-10-CM | POA: Diagnosis not present

## 2016-07-14 DIAGNOSIS — M79605 Pain in left leg: Secondary | ICD-10-CM | POA: Diagnosis not present

## 2016-07-14 DIAGNOSIS — S6992XA Unspecified injury of left wrist, hand and finger(s), initial encounter: Secondary | ICD-10-CM | POA: Diagnosis present

## 2016-07-14 LAB — CBC
HEMATOCRIT: 39.1 % (ref 36.0–46.0)
Hemoglobin: 12.7 g/dL (ref 12.0–15.0)
MCH: 27.7 pg (ref 26.0–34.0)
MCHC: 32.5 g/dL (ref 30.0–36.0)
MCV: 85.2 fL (ref 78.0–100.0)
Platelets: 276 10*3/uL (ref 150–400)
RBC: 4.59 MIL/uL (ref 3.87–5.11)
RDW: 13.5 % (ref 11.5–15.5)
WBC: 8.8 10*3/uL (ref 4.0–10.5)

## 2016-07-14 LAB — I-STAT CHEM 8, ED
BUN: 7 mg/dL (ref 6–20)
CHLORIDE: 100 mmol/L — AB (ref 101–111)
Calcium, Ion: 1.16 mmol/L (ref 1.13–1.30)
Creatinine, Ser: 0.8 mg/dL (ref 0.44–1.00)
GLUCOSE: 111 mg/dL — AB (ref 65–99)
HCT: 41 % (ref 36.0–46.0)
Hemoglobin: 13.9 g/dL (ref 12.0–15.0)
POTASSIUM: 3.6 mmol/L (ref 3.5–5.1)
Sodium: 140 mmol/L (ref 135–145)
TCO2: 28 mmol/L (ref 0–100)

## 2016-07-14 LAB — POC URINE PREG, ED: Preg Test, Ur: NEGATIVE

## 2016-07-14 MED ORDER — LORAZEPAM 2 MG/ML IJ SOLN: 0.5000 mg | Freq: Once | INTRAMUSCULAR | Status: DC

## 2016-07-14 MED ORDER — ONDANSETRON HCL 4 MG/2ML IJ SOLN
4.0000 mg | Freq: Once | INTRAMUSCULAR | Status: AC
  Administered 2016-07-14: 4 mg via INTRAVENOUS
  Filled 2016-07-14: qty 2

## 2016-07-14 MED ORDER — HYDROMORPHONE HCL 1 MG/ML IJ SOLN: 0.5000 mg | Freq: Once | INTRAMUSCULAR | Status: DC

## 2016-07-14 MED ORDER — TETANUS-DIPHTHERIA TOXOIDS TD 5-2 LFU IM INJ: 0.5000 mL | INJECTION | Freq: Once | INTRAMUSCULAR | Status: DC

## 2016-07-14 MED ORDER — HYDROMORPHONE HCL 1 MG/ML IJ SOLN
1.0000 mg | Freq: Once | INTRAMUSCULAR | Status: AC
  Administered 2016-07-14: 1 mg via INTRAVENOUS
  Filled 2016-07-14: qty 1

## 2016-07-14 MED ORDER — TETANUS-DIPHTH-ACELL PERTUSSIS 5-2.5-18.5 LF-MCG/0.5 IM SUSP
0.5000 mL | Freq: Once | INTRAMUSCULAR | Status: AC
  Administered 2016-07-14: 0.5 mL via INTRAMUSCULAR
  Filled 2016-07-14: qty 0.5
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
## 2016-07-15 ENCOUNTER — Emergency Department (HOSPITAL_COMMUNITY): Payer: No Typology Code available for payment source

## 2016-07-15 LAB — COMPREHENSIVE METABOLIC PANEL
ALBUMIN: 3.8 g/dL (ref 3.5–5.0)
ALT: 16 U/L (ref 14–54)
AST: 22 U/L (ref 15–41)
Alkaline Phosphatase: 44 U/L (ref 38–126)
Anion gap: 7 (ref 5–15)
BUN: 6 mg/dL (ref 6–20)
CHLORIDE: 103 mmol/L (ref 101–111)
CO2: 26 mmol/L (ref 22–32)
CREATININE: 0.82 mg/dL (ref 0.44–1.00)
Calcium: 9.1 mg/dL (ref 8.9–10.3)
GFR calc Af Amer: >60 mL/min (ref >60)
GFR calc non Af Amer: >60 mL/min (ref >60)
GLUCOSE: 111 mg/dL — AB (ref 65–99)
Potassium: 3.5 mmol/L (ref 3.5–5.1)
SODIUM: 136 mmol/L (ref 135–145)
Total Bilirubin: 0.5 mg/dL (ref 0.3–1.2)
Total Protein: 7.1 g/dL (ref 6.5–8.1)

## 2016-07-15 LAB — URINALYSIS, ROUTINE W REFLEX MICROSCOPIC
BILIRUBIN URINE: NEGATIVE
Glucose, UA: NEGATIVE mg/dL
Ketones, ur: NEGATIVE mg/dL
Nitrite: NEGATIVE
PH: 6 (ref 5.0–8.0)
Protein, ur: NEGATIVE mg/dL
SPECIFIC GRAVITY, URINE: 1.017 (ref 1.005–1.030)

## 2016-07-15 LAB — URINE MICROSCOPIC-ADD ON

## 2016-07-15 LAB — PROTIME-INR
INR: 1.06
Prothrombin Time: 13.8 seconds (ref 11.4–15.2)

## 2016-07-15 MED ORDER — CYCLOBENZAPRINE HCL 10 MG PO TABS: 5.0000 mg | ORAL_TABLET | Freq: Two times a day (BID) | ORAL | 0 refills | Status: DC | PRN

## 2016-07-15 MED ORDER — HYDROCODONE-ACETAMINOPHEN 5-325 MG PO TABS: 1.0000 | ORAL_TABLET | ORAL | 0 refills | Status: DC | PRN

## 2016-07-15 MED ORDER — KETOROLAC TROMETHAMINE 30 MG/ML IJ SOLN
30.0000 mg | Freq: Once | INTRAMUSCULAR | Status: AC
  Administered 2016-07-15: 30 mg via INTRAVENOUS
  Filled 2016-07-15: qty 1

## 2016-07-15 MED ORDER — IBUPROFEN 600 MG PO TABS: 600.0000 mg | ORAL_TABLET | Freq: Four times a day (QID) | ORAL | 0 refills | Status: DC | PRN
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
## 2016-07-16 LAB — URINE CULTURE

## 2016-07-25 ENCOUNTER — Emergency Department (HOSPITAL_COMMUNITY): Payer: No Typology Code available for payment source

## 2016-07-25 ENCOUNTER — Emergency Department (HOSPITAL_COMMUNITY)
Admission: EM | Admit: 2016-07-25 | Discharge: 2016-07-25 | Disposition: A | Discharge status: Home / Self Care | Payer: No Typology Code available for payment source | Attending: Emergency Medicine | Admitting: Emergency Medicine

## 2016-07-25 DIAGNOSIS — R351 Nocturia: Secondary | ICD-10-CM | POA: Insufficient documentation

## 2016-07-25 DIAGNOSIS — R32 Unspecified urinary incontinence: Secondary | ICD-10-CM | POA: Diagnosis present

## 2016-07-25 LAB — URINALYSIS, ROUTINE W REFLEX MICROSCOPIC
Bilirubin Urine: NEGATIVE
Glucose, UA: NEGATIVE mg/dL
KETONES UR: NEGATIVE mg/dL
LEUKOCYTES UA: NEGATIVE
NITRITE: NEGATIVE
PROTEIN: NEGATIVE mg/dL
Specific Gravity, Urine: 1.031 — ABNORMAL HIGH (ref 1.005–1.030)
pH: 5.5 (ref 5.0–8.0)

## 2016-07-25 LAB — URINE MICROSCOPIC-ADD ON

## 2016-07-25 LAB — POC URINE PREG, ED: PREG TEST UR: NEGATIVE

## 2016-07-25 NOTE — ED Notes (Signed)
RN at bedside
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
## 2016-07-25 NOTE — ED Notes (Signed)
Pt to xray

## 2016-07-25 NOTE — ED Notes (Signed)
PT A&Ox3 no questions upon discharge.
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
## 2016-07-26 LAB — URINE CULTURE

## 2020-03-08 ENCOUNTER — Emergency Department
Admission: EM | Admit: 2020-03-08 | Discharge: 2020-03-08 | Disposition: A | Discharge status: Home / Self Care | Payer: Self-pay | Source: Home / Self Care

## 2020-03-08 ENCOUNTER — Encounter: Payer: Self-pay | Admitting: Emergency Medicine

## 2020-03-08 ENCOUNTER — Other Ambulatory Visit: Payer: Self-pay

## 2020-03-08 DIAGNOSIS — N926 Irregular menstruation, unspecified: Secondary | ICD-10-CM | POA: Diagnosis not present

## 2020-03-08 DIAGNOSIS — Z3201 Encounter for pregnancy test, result positive: Secondary | ICD-10-CM

## 2020-03-08 DIAGNOSIS — R110 Nausea: Secondary | ICD-10-CM

## 2020-03-08 DIAGNOSIS — R350 Frequency of micturition: Secondary | ICD-10-CM

## 2020-03-08 LAB — POCT URINALYSIS DIP (MANUAL ENTRY)
Bilirubin, UA: NEGATIVE
Glucose, UA: NEGATIVE mg/dL
Ketones, POC UA: NEGATIVE mg/dL
Leukocytes, UA: NEGATIVE
Nitrite, UA: NEGATIVE
Protein Ur, POC: 30 mg/dL — AB
Spec Grav, UA: 1.02 (ref 1.010–1.025)
Urobilinogen, UA: 2 E.U./dL — AB
pH, UA: 8.5 — AB (ref 5.0–8.0)

## 2020-03-08 LAB — POCT URINE PREGNANCY: Preg Test, Ur: POSITIVE — AB

## 2020-03-08 MED ORDER — VITAMIN B-6 25 MG PO TABS: 25.0000 mg | ORAL_TABLET | Freq: Every day | ORAL | 0 refills | Status: DC

## 2020-03-08 NOTE — Discharge Instructions (Signed)
°  You may try the prescribed medication to help with your pregnancy related nausea. You should keep hydrated as much as possible and try eating small snacks throughout the day to help with nausea.  You should start taking an over the counter prenatal vitamin.  Make sure it has folic acid.  If nausea is severe, you may want to try the gummie prenatal vitamins and take after dinner/before bed to limit any nausea related to the vitamin.  Call Monday to schedule a follow up appointment with an OB/GYN. You should be called about the blood work today when it comes back and you can view your results in your [facility] MyChart account. This will help given an estimate of how far along you are.  If you develop severe abdominal pain, vaginal bleeding, dizziness/passing out, or other concerning symptoms develop, please go to [facility] Emergency Department, they [facility] there as well.  If symptoms are severe, call 911 or go to the closest emergency department.

## 2020-03-08 NOTE — ED Triage Notes (Signed)
C/o nausea x 2 weeks  Frequent urination Last period was Jan 10th, 2021 irregular periods Mirena removed one year ago

## 2020-03-08 NOTE — ED Provider Notes (Signed)
[facility]    CSN: [id number] Arrival date & time: 03/08/20  1138      History   Chief Complaint Chief Complaint  Patient presents with  . Nausea  . Urinary Frequency    HPI Armina S Depaoli is a 34 y.o. female.   HPI Jalexis Breed Newburn is a 34 y.o. female presenting to UC with c/o nausea without vomiting for 2 weeks and urinary frequency. She is not sure if she is pregnant or has a UTI. Her LMP was in January 10th, 2021.  She has a hx of irregular periods. She had her mirena removed 1 year ago and is not currently on any other birth control. She is recently married.  No vaginal symptoms. Denies concern for STIs.  Denies fever, chills, abdominal pain at this time but she has had mild LLQ cramping on occasion.  Pt reports having 2 boys already. She does not currently have an OB/GYN.     Past Medical History:  Diagnosis Date  . Anemia   . Sickle cell trait [facility])     Patient Active Problem List   Diagnosis Date Noted  . Amniotic fluid leaking 01/11/2014  . Streptococcus B carrier state complicating pregnancy 12/75/1700  . Sickle-cell trait ([facility] [facility]) 07/12/2013  . Supervision of other normal pregnancy 07/11/2013    Past Surgical History:  Procedure Laterality Date  . CYST REMOVAL NECK    . NO PAST SURGERIES    . WISDOM TOOTH EXTRACTION      OB History    Gravida  3   Para  2   Term  2   Preterm  0   AB  1   Living  2     SAB  1   TAB      Ectopic      Multiple      Live Births  2            Home Medications    Prior to Admission medications   Medication Sig Start Date End Date Taking? Authorizing Provider  cyclobenzaprine (FLEXERIL) 10 MG tablet Take 0.5-1 tablets (5-10 mg total) by mouth 2 (two) times daily as needed. Patient taking differently: Take 5-10 mg by mouth 2 (two) times daily as needed for muscle spasms.  07/15/16   Delos Haring, PA-C  HYDROcodone-acetaminophen (NORCO/VICODIN) 5-325 MG tablet Take 1-2 tablets by  mouth every 4 (four) hours as needed. Patient taking differently: Take 1-2 tablets by mouth every 4 (four) hours as needed for moderate pain.  07/15/16   Delos Haring, PA-C  ibuprofen (ADVIL,MOTRIN) 600 MG tablet Take 1 tablet (600 mg total) by mouth every 6 (six) hours as needed. Patient taking differently: Take 600 mg by mouth every 6 (six) hours as needed for moderate pain.  07/15/16   Delos Haring, PA-C  levonorgestrel (MIRENA) 20 MCG/24HR IUD 1 each by Intrauterine route once.    [provider]  Multiple Vitamin (MULTIVITAMIN WITH MINERALS) TABS tablet Take 1 tablet by mouth daily.    [provider]  vitamin B-6 (PYRIDOXINE) 25 MG tablet Take 1 tablet (25 mg total) by mouth daily. 03/08/20   Noe Gens, PA-C    Family History Family History  Problem Relation Age of Onset  . COPD Paternal Grandmother   . Kidney disease Mother     Social History Social History   Tobacco Use  . Smoking status: Never Smoker  . Smokeless tobacco: Never Used  Substance Use Topics  . Alcohol  use: No    Comment: Wine in the past  . Drug use: No     Allergies   Patient has no known allergies.   Review of Systems Review of Systems  Constitutional: Negative for appetite change, chills, fatigue and fever.  Gastrointestinal: Positive for nausea. Negative for diarrhea and vomiting.  Genitourinary: Positive for frequency and pelvic pain (mild cramping, left side, intermittent). Negative for dysuria, hematuria, vaginal bleeding, vaginal discharge and vaginal pain.  Musculoskeletal: Negative for back pain.  Neurological: Negative for dizziness, light-headedness and headaches.     Physical Exam Triage Vital Signs ED Triage Vitals  Enc Vitals Group     BP 03/08/20 1152 108/71     Pulse Rate 03/08/20 1152 81     Resp 03/08/20 1152 15     Temp 03/08/20 1152 98.1 F (36.7 C)     Temp Source 03/08/20 1152 Oral     SpO2 03/08/20 1152 98 %     Weight --      Height --       Head Circumference --      Peak Flow --      Pain Score 03/08/20 1155 2     Pain Loc --      Pain Edu? --      Excl. in GC? --    No data found.  Updated Vital Signs BP 108/71 (BP Location: Right Arm)   Pulse 81   Temp 98.1 F (36.7 C) (Oral)   Resp 15   LMP 12/15/2019 (Exact Date)   SpO2 98%   Visual Acuity Right Eye Distance:   Left Eye Distance:   Bilateral Distance:    Right Eye Near:   Left Eye Near:    Bilateral Near:     Physical Exam Vitals and nursing note reviewed.  Constitutional:      Appearance: Normal appearance. She is well-developed.  HENT:     Head: Normocephalic and atraumatic.     Mouth/Throat:     Mouth: Mucous membranes are moist.  Cardiovascular:     Rate and Rhythm: Normal rate and regular rhythm.  Pulmonary:     Effort: Pulmonary effort is normal. No respiratory distress.     Breath sounds: Normal breath sounds.  Abdominal:     General: There is no distension.     Palpations: Abdomen is soft. There is no mass.     Tenderness: There is no abdominal tenderness. There is no right CVA tenderness or left CVA tenderness.  Musculoskeletal:        General: Normal range of motion.     Cervical back: Normal range of motion.  Skin:    General: Skin is warm and dry.  Neurological:     Mental Status: She is alert and oriented to person, place, and time.  Psychiatric:        Behavior: Behavior normal.      UC Treatments / Results  Labs (all labs ordered are listed, but only abnormal results are displayed) Labs Reviewed  POCT URINALYSIS DIP (MANUAL ENTRY) - Abnormal; Notable for the following components:      Result Value   Blood, UA trace-lysed (*)    pH, UA 8.5 (*)    Protein Ur, POC =30 (*)    Urobilinogen, UA 2.0 (*)    All other components within normal limits  POCT URINE PREGNANCY - Abnormal; Notable for the following components:   Preg Test, Ur Positive (*)    All other components within  normal limits  HCG, QUANTITATIVE,  PREGNANCY    EKG   Radiology No results found.  Procedures Procedures (including critical care time)  Medications Ordered in UC Medications - No data to display  Initial Impression / Assessment and Plan / UC Course  I have reviewed the triage vital signs and the nursing notes.  Pertinent labs & imaging results that were available during my care of the patient were reviewed by me and considered in my medical decision making (see chart for details).     Pt appears well, NAD UA: no evidence of UTI Urine pregnancy: POSITIVE Beta Hcg: pending No concern for ectopic at this time. Discussed symptoms that warrant emergent care in the ED. Will tx pregnancy related nausea with Vit B6 Encouraged to call [facility]N at [facility] tomorrow to establish care with prenatal care. AVS provided  Final Clinical Impressions(s) / UC Diagnoses   Final diagnoses:  Irregular menses  Nausea without vomiting  Urinary frequency  Positive urine pregnancy test     Discharge Instructions      You may try the prescribed medication to help with your pregnancy related nausea. You should keep hydrated as much as possible and try eating small snacks throughout the day to help with nausea.  You should start taking an over the counter prenatal vitamin.  Make sure it has folic acid.  If nausea is severe, you may want to try the gummie prenatal vitamins and take after dinner/before bed to limit any nausea related to the vitamin.  Call Monday to schedule a follow up appointment with an OB/GYN. You should be called about the blood work today when it comes back and you can view your results in your [facility] MyChart account. This will help given an estimate of how far along you are.  If you develop severe abdominal pain, vaginal bleeding, dizziness/passing out, or other concerning symptoms develop, please go to [facility] Emergency Department, they have [facility]  there as well.  If symptoms are severe, call 911 or go to the closest emergency department.     ED Prescriptions    Medication Sig Dispense Auth. Provider   vitamin B-6 (PYRIDOXINE) 25 MG tablet  (Status: Discontinued) Take 1 tablet (25 mg total) by mouth daily. 30 tablet Waylan Rocher O, PA-C   vitamin B-6 (PYRIDOXINE) 25 MG tablet Take 1 tablet (25 mg total) by mouth daily. 30 tablet Lurene Shadow, [place]     PDMP not reviewed this encounter.   Lurene Shadow, [place] 03/08/20 1331

## 2020-03-10 LAB — HCG, QUANTITATIVE, PREGNANCY: HCG, Total, QN: 179750 m[IU]/mL

## 2024-10-21 ENCOUNTER — Ambulatory Visit: Payer: Self-pay

## 2024-10-21 ENCOUNTER — Other Ambulatory Visit: Payer: Self-pay

## 2024-10-21 ENCOUNTER — Ambulatory Visit
Admission: EM | Admit: 2024-10-21 | Discharge: 2024-10-21 | Disposition: A | Discharge status: Home / Self Care | Payer: Self-pay | Attending: Family Medicine | Admitting: Family Medicine

## 2024-10-21 DIAGNOSIS — R079 Chest pain, unspecified: Secondary | ICD-10-CM

## 2024-10-21 NOTE — ED Provider Notes (Signed)
 [facility]    CSN: [id number] Arrival date & time: 10/21/24  1609      History   Chief Complaint Chief Complaint  Patient presents with   Chest Pain    HPI Barbara Heath is a 38 y.o. female.   HPI  Patient is dates that she is here for chest pain.  It happened briefly yesterday and she took Advil  and it went away.  Today she had it again.  She states it is in the center of her chest.  It feels like pressure.  It makes her feel briefly short of breath.  Goes away spontaneously.  She has no history of lung disease or asthma.  She has had no respiratory infection or cough.  She has no history of any heart problems, no CAD risk factors such as hypertension hyperlipidemia or diabetes.  She is a non-smoker.  Admits she may be under some stress.  No GI symptoms or GERD or heartburn.  No change in activity.  No trauma or fall  Past Medical History:  Diagnosis Date   Anemia    Sickle cell trait     Patient Active Problem List   Diagnosis Date Noted   Amniotic fluid leaking 01/11/2014   Streptococcus B carrier state complicating pregnancy 12/25/2013   Sickle-cell trait 07/12/2013   Supervision of other normal pregnancy 07/11/2013    Past Surgical History:  Procedure Laterality Date   CYST REMOVAL NECK     NO PAST SURGERIES     WISDOM TOOTH EXTRACTION      OB History     Gravida  3   Para  2   Term  2   Preterm  0   AB  1   Living  2      SAB  1   IAB      Ectopic      Multiple      Live Births  2            Home Medications    Prior to Admission medications   Medication Sig Start Date End Date Taking? Authorizing Provider  levonorgestrel  (MIRENA ) 20 MCG/24HR IUD 1 each by Intrauterine route once.    [provider]    Family History Family History  Problem Relation Age of Onset   COPD Paternal Grandmother    Kidney disease Mother     Social History Social History   Tobacco Use   Smoking status: Never    Smokeless tobacco: Never  Substance Use Topics   Alcohol use: No    Comment: Wine in the past   Drug use: No     Allergies   Patient has no known allergies.   Review of Systems Review of Systems  See HPI Physical Exam Triage Vital Signs ED Triage Vitals  Encounter Vitals Group     BP 10/21/24 1626 132/88     Girls Systolic BP Percentile --      Girls Diastolic BP Percentile --      Boys Systolic BP Percentile --      Boys Diastolic BP Percentile --      Pulse Rate 10/21/24 1626 83     Resp 10/21/24 1626 18     Temp 10/21/24 1626 98.4 F (36.9 C)     Temp Source 10/21/24 1626 Oral     SpO2 10/21/24 1626 100 %     Weight 10/21/24 1627 196 lb (88.9 kg)     Height 10/21/24 1627  5' 4 (1.626 m)     Head Circumference --      Peak Flow --      Pain Score 10/21/24 1627 5     Pain Loc --      Pain Education --      Exclude from Growth Chart --    No data found.  Updated Vital Signs BP 132/88 (BP Location: Right Arm)   Pulse 83   Temp 98.4 F (36.9 C) (Oral)   Resp 18   Ht 5' 4 (1.626 m)   Wt 88.9 kg   LMP  (LMP Unknown) Comment: No period  SpO2 100%   BMI 33.64 kg/m       Physical Exam Constitutional:      General: She is not in acute distress.    Appearance: She is well-developed.  HENT:     Head: Normocephalic and atraumatic.  Eyes:     Conjunctiva/sclera: Conjunctivae normal.     Pupils: Pupils are equal, round, and reactive to light.  Cardiovascular:     Rate and Rhythm: Normal rate and regular rhythm.     Chest Wall: PMI is not displaced.     Heart sounds: Normal heart sounds.  Pulmonary:     Effort: Pulmonary effort is normal. No respiratory distress.     Breath sounds: Normal breath sounds.  Chest:     Chest wall: Tenderness present.     Comments: Mild tenderness to pressure on the central sternum Abdominal:     General: There is no distension.     Palpations: Abdomen is soft.     Tenderness: There is no abdominal tenderness.   Musculoskeletal:        General: Normal range of motion.     Cervical back: Normal range of motion.     Right lower leg: No edema.     Left lower leg: No edema.  Skin:    General: Skin is warm and dry.  Neurological:     General: No focal deficit present.     Mental Status: She is alert.      UC Treatments / Results  Labs (all labs ordered are listed, but only abnormal results are displayed) Labs Reviewed - No data to display  EKG   Radiology DG Chest 2 View Result Date: 10/21/2024 CLINICAL DATA:  Upper chest pain x1 day. EXAM: CHEST - 2 VIEW COMPARISON:  July 15, 2016 FINDINGS: The heart size and mediastinal contours are within normal limits. Both lungs are clear. The visualized skeletal structures are unremarkable. IMPRESSION: No active cardiopulmonary disease. Electronically Signed   By: Suzen Dials M.D.   On: 10/21/2024 17:41    Procedures Procedures (including critical care time)  Medications Ordered in UC Medications - No data to display  Initial Impression / Assessment and Plan / UC Course  I have reviewed the triage vital signs and the nursing notes.  Pertinent labs & imaging results that were available during my care of the patient were reviewed by me and considered in my medical decision making (see chart for details).     EKG is normal Chest x-ray is normal Discussed I feel she likely has some mild chest wall pain Return as needed Final Clinical Impressions(s) / UC Diagnoses   Final diagnoses:  Chest pain, unspecified type     Discharge Instructions      May continue advil  for chest discomfort Call or return for problems     ED Prescriptions   None  PDMP not reviewed this encounter.   Maranda Jamee Jacob, MD 10/21/24 [phone]

## 2024-10-21 NOTE — Discharge Instructions (Signed)
 May continue advil  for chest discomfort Call or return for problems

## 2024-10-21 NOTE — ED Triage Notes (Signed)
 Pt presented with chest pain x 1 day. Pt stated pain is in her upper chest, non radiating . Pt stated she  experiences SOB at times with the chest pain. Pt stated that she took Ibuprofen  this am which was effective however the pain returned this afternoon.
